# Patient Record
Sex: Male | Born: 1950 | Race: Black or African American | Hispanic: No | Marital: Married | Smoking: Former smoker
Health system: Southern US, Community
[De-identification: ages and names within clinical notes are randomized; demographics above are authoritative.]

## PROBLEM LIST (undated history)

## (undated) DIAGNOSIS — M109 Gout, unspecified: Secondary | ICD-10-CM

## (undated) DIAGNOSIS — J45909 Unspecified asthma, uncomplicated: Secondary | ICD-10-CM

## (undated) DIAGNOSIS — I10 Essential (primary) hypertension: Secondary | ICD-10-CM

## (undated) HISTORY — DX: Gout, unspecified: M10.9

## (undated) HISTORY — DX: Unspecified asthma, uncomplicated: J45.909

## (undated) HISTORY — PX: NO PAST SURGERIES: SHX2092

---

## 2013-01-13 DIAGNOSIS — M26629 Arthralgia of temporomandibular joint, unspecified side: Secondary | ICD-10-CM | POA: Insufficient documentation

## 2013-01-31 DIAGNOSIS — J45909 Unspecified asthma, uncomplicated: Secondary | ICD-10-CM | POA: Insufficient documentation

## 2013-05-25 DIAGNOSIS — M109 Gout, unspecified: Secondary | ICD-10-CM | POA: Insufficient documentation

## 2013-07-20 ENCOUNTER — Encounter: Payer: Self-pay | Admitting: Pulmonary Disease

## 2013-07-20 ENCOUNTER — Ambulatory Visit (INDEPENDENT_AMBULATORY_CARE_PROVIDER_SITE_OTHER): Payer: 59 | Admitting: Pulmonary Disease

## 2013-07-20 VITALS — BP 110/82 | HR 74 | Ht 69.5 in | Wt 225.0 lb

## 2013-07-20 DIAGNOSIS — J45990 Exercise induced bronchospasm: Secondary | ICD-10-CM | POA: Insufficient documentation

## 2013-07-20 DIAGNOSIS — J45909 Unspecified asthma, uncomplicated: Secondary | ICD-10-CM

## 2013-07-20 DIAGNOSIS — J45998 Other asthma: Secondary | ICD-10-CM

## 2013-07-20 DIAGNOSIS — J449 Chronic obstructive pulmonary disease, unspecified: Secondary | ICD-10-CM | POA: Insufficient documentation

## 2013-07-20 NOTE — Progress Notes (Signed)
Chief Complaint  Patient presents with  . Pulmonary Consult    self referral for asthma    History of Present Illness: Connor Palmer is a 62 y.o. male former smoker for evaluation of asthma.  He is from [place].  He retired from work 14 months ago, and moved to [place].  He used to work as a Dentist.  He was diagnosed with asthma as a child.  He was treated by his PCP in [place].  He has not had recent PFT's or chest xray.    He has been using advair and albuterol for years.  He only uses advair once per day >> he didn't want to get dependent on this.  He uses albuterol 3 to 4 times per day.  He was last on prednisone 1 year ago.  He tried singulair several years ago - he is not sure why it was stopped.  He has trouble with seasonal allergies >> usually worse in Spring and Fall.  His allergies have gotten worse since moving to [place].  He also notices more trouble when gardening or working outside.  He is not having any trouble with his sinuses at present, and denies sore throat.  He has history of allergy to penicillin - he is not sure of reaction, and was told he had a problem with penicillin as a child.  He denies any trouble with reaction to aspirin or NSAIDS.  He gets itching/swelling with cashews, pistachios, and almonds.  He gets local reaction with bee stings.  He has remote history of smoking - stopped 25 years ago.  He denies history of pneumonia or exposure to tuberculosis.  He was not in the [employer]. He denies exposure to animals or birds.  Bernd Diana  has a past medical history of Asthma and Gout.  Mayo Demore  has past surgical history that includes No past surgeries.  Prior to Admission medications   Medication Sig Start Date End Date Taking? Authorizing Provider  albuterol (PROVENTIL HFA;VENTOLIN HFA) 108 (90 BASE) MCG/ACT inhaler Inhale 1 puff into the lungs every 6 (six) hours as needed for wheezing or shortness of breath.   Yes  Historical Provider, MD  colchicine-probenecid 0.5-500 MG per tablet Take 1 tablet by mouth daily.   Yes Historical Provider, MD  cyclobenzaprine (FLEXERIL) 10 MG tablet Take 10 mg by mouth 3 (three) times daily as needed for muscle spasms.   Yes Historical Provider, MD  Fluticasone-Salmeterol (ADVAIR) 100-50 MCG/DOSE AEPB Inhale 1 puff into the lungs 2 (two) times daily.   Yes Historical Provider, MD    Allergies  Allergen Reactions  . Penicillins     His family history includes Allergies in his father; Rheum arthritis in his mother.  He  reports that he quit smoking about 25 years ago. His smoking use included Cigarettes. He has a .75 pack-year smoking history. He has never used smokeless tobacco. He reports that he does not use illicit drugs.   Physical Exam:  General - No distress ENT - No sinus tenderness, no oral exudate, no LAN, no thyromegaly, TM clear, pupils equal/reactive Cardiac - s1s2 regular, no murmur, pulses symmetric Chest - basilar rhonchi Rt > Lt that clear with cough, no wheeze/rales Back - No focal tenderness Abd - Soft, non-tender, no organomegaly, + bowel sounds Ext - No edema Neuro - Normal strength, cranial nerves intact Skin - No rashes Psych - Normal mood, and behavior   Assessment/Plan:  Coralyn Helling, MD [facility]/Critical  Care/Sleep Pager:  [phone]

## 2013-07-20 NOTE — Patient Instructions (Signed)
Use advair twice per day >> rinse mouth after each use Albuterol two puffs up to four times per day as needed Follow up in 6 to 8 weeks

## 2013-07-20 NOTE — Assessment & Plan Note (Signed)
He has long history of asthma.  He likely has an allergic component to his disease.  His recent symptoms are likely related to viral URI >> I don't think he needs antibiotics or prednisone at this time.  Discussed the proper role of inhaler therapy, and emphasized the role of ICS as controller medication and albuterol as rescue medication.  Will defer chest xray and PFT for now >> he will need these at some point.  Will re-assess his status at next visit, and then decide if he needs further allergy evaluation.  He will need to get influenza vaccination once he has fully recovered from his URI.  He will need pneumonia vaccine >> can be done at next visit.

## 2013-07-20 NOTE — Assessment & Plan Note (Signed)
He notices more trouble with asthma control with exercise.  Discussed pretreatment with albuterol, and doing short/high intensity warm up prior to exercising to condition airways.  Otherwise encouraged him to keep up with his exercise regimen.

## 2013-08-31 ENCOUNTER — Encounter: Payer: Self-pay | Admitting: Pulmonary Disease

## 2013-08-31 ENCOUNTER — Ambulatory Visit (INDEPENDENT_AMBULATORY_CARE_PROVIDER_SITE_OTHER): Payer: 59 | Admitting: Pulmonary Disease

## 2013-08-31 VITALS — BP 132/92 | HR 82 | Ht 69.0 in | Wt 230.0 lb

## 2013-08-31 DIAGNOSIS — Z23 Encounter for immunization: Secondary | ICD-10-CM

## 2013-08-31 DIAGNOSIS — J45998 Other asthma: Secondary | ICD-10-CM

## 2013-08-31 DIAGNOSIS — J45909 Unspecified asthma, uncomplicated: Secondary | ICD-10-CM

## 2013-08-31 NOTE — Assessment & Plan Note (Signed)
Stable on advair.  Advise him to only use albuterol as needed.    Will give flu shot today.  Will f/u in 4 months and assess his allergy control.

## 2013-08-31 NOTE — Patient Instructions (Signed)
Flu shot today ° °Follow up in 4 months °

## 2013-08-31 NOTE — Progress Notes (Signed)
Chief Complaint  Patient presents with  . Asthma    Breathing has been doing well. Report having chest congestion. Denies chest tightness, SOB or coughing.    History of Present Illness: Connor Palmer is a 63 y.o. male with asthma.  He has been doing well.  He gets occasional cough with clear sputum.  He denies wheeze, fever, sinus congestion, or sore throat.  He uses advair twice per day.  He uses albuterol in the morning before using advair as a matter of routine.   TESTS:  Connor Palmer  has a past medical history of Asthma and Gout.  Connor Palmer  has past surgical history that includes No past surgeries.  Prior to Admission medications   Medication Sig Start Date End Date Taking? Authorizing Provider  albuterol (PROVENTIL HFA;VENTOLIN HFA) 108 (90 BASE) MCG/ACT inhaler Inhale 1 puff into the lungs every 6 (six) hours as needed for wheezing or shortness of breath.   Yes Historical Provider, MD  Fluticasone-Salmeterol (ADVAIR) 100-50 MCG/DOSE AEPB Inhale 1 puff into the lungs 2 (two) times daily.   Yes Historical Provider, MD    Allergies  Allergen Reactions  . Penicillins      Physical Exam:  General - No distress ENT - No sinus tenderness, no oral exudate, no LAN Cardiac - s1s2 regular, no murmur Chest - No wheeze/rales/dullness Back - No focal tenderness Abd - Soft, non-tender Ext - No edema Neuro - Normal strength Skin - No rashes Psych - normal mood, and behavior   Assessment/Plan:  Coralyn HellingVineet Trinda Harlacher, MD [facility]/Critical Care/Sleep Pager:  [phone]

## 2013-10-21 DIAGNOSIS — I10 Essential (primary) hypertension: Secondary | ICD-10-CM | POA: Insufficient documentation

## 2014-03-05 ENCOUNTER — Encounter (INDEPENDENT_AMBULATORY_CARE_PROVIDER_SITE_OTHER): Payer: Self-pay

## 2014-03-05 ENCOUNTER — Ambulatory Visit (INDEPENDENT_AMBULATORY_CARE_PROVIDER_SITE_OTHER)
Admission: RE | Admit: 2014-03-05 | Discharge: 2014-03-05 | Disposition: A | Discharge status: Home / Self Care | Payer: 59 | Source: Ambulatory Visit | Attending: Pulmonary Disease | Admitting: Pulmonary Disease

## 2014-03-05 ENCOUNTER — Encounter: Payer: Self-pay | Admitting: Pulmonary Disease

## 2014-03-05 ENCOUNTER — Telehealth: Payer: Self-pay | Admitting: Pulmonary Disease

## 2014-03-05 ENCOUNTER — Ambulatory Visit (INDEPENDENT_AMBULATORY_CARE_PROVIDER_SITE_OTHER): Payer: 59 | Admitting: Pulmonary Disease

## 2014-03-05 VITALS — BP 120/86 | HR 66 | Temp 98.2°F | Ht 70.0 in | Wt 211.8 lb

## 2014-03-05 DIAGNOSIS — H9190 Unspecified hearing loss, unspecified ear: Secondary | ICD-10-CM

## 2014-03-05 DIAGNOSIS — J449 Chronic obstructive pulmonary disease, unspecified: Secondary | ICD-10-CM

## 2014-03-05 DIAGNOSIS — J4489 Other specified chronic obstructive pulmonary disease: Secondary | ICD-10-CM

## 2014-03-05 DIAGNOSIS — H9193 Unspecified hearing loss, bilateral: Secondary | ICD-10-CM

## 2014-03-05 DIAGNOSIS — J45998 Other asthma: Secondary | ICD-10-CM

## 2014-03-05 MED ORDER — FLUTICASONE-SALMETEROL 250-50 MCG/DOSE IN AEPB: 1.0000 | INHALATION_SPRAY | Freq: Every day | RESPIRATORY_TRACT | Status: DC

## 2014-03-05 NOTE — Telephone Encounter (Signed)
LMOM x 1 

## 2014-03-05 NOTE — Telephone Encounter (Signed)
Dg Chest 2 View  03/05/2014   CLINICAL DATA:  Asthma and COPD  EXAM: CHEST  2 VIEW  COMPARISON:  None.  FINDINGS: COPD with hyperinflation of the lungs. Mild bibasilar scarring. Negative for pneumonia or effusion. No heart failure or mass lesion.  IMPRESSION: COPD with mild bibasilar atelectasis.   Electronically Signed   By: Marlan Palauharles  Clark M.D.   On: 03/05/2014 14:45    Will have my nurse inform pt that CXR shows changes from chronic asthma.  No change to current tx plan.

## 2014-03-05 NOTE — Assessment & Plan Note (Addendum)
He has severe obstruction on spirometry today.  Will get full PFT and CXR to further assess.  He can continue advair and prn albuterol for now.

## 2014-03-05 NOTE — Patient Instructions (Addendum)
Will arrange for referral to audiology Chest xray today Will schedule breathing test (PFT) Follow up in 6 months

## 2014-03-05 NOTE — Progress Notes (Signed)
Chief Complaint  Patient presents with  . Follow-up    No complaints    History of Present Illness: Connor Palmer is a 63 y.o. male with asthma.  He has been doing well.  He uses advair once per day. He is not having cough, wheeze, or sputum.  He denies skin rash or hives.  He is not having trouble with allergies.  He has noticed trouble with his hearing.  TESTS: Spirometry 03/05/14 >> FEV1 1.06 (34%), FEV1% 43  Connor Palmer  has a past medical history of Asthma and Gout.  Connor Palmer  has past surgical history that includes No past surgeries.  Prior to Admission medications   Medication Sig Start Date End Date Taking? Authorizing Provider  albuterol (PROVENTIL HFA;VENTOLIN HFA) 108 (90 BASE) MCG/ACT inhaler Inhale 1 puff into the lungs every 6 (six) hours as needed for wheezing or shortness of breath.   Yes Historical Provider, MD  Fluticasone-Salmeterol (ADVAIR) 100-50 MCG/DOSE AEPB Inhale 1 puff into the lungs 2 (two) times daily.   Yes Historical Provider, MD    Allergies  Allergen Reactions  . Penicillins      Physical Exam:  General - No distress ENT - No sinus tenderness, no oral exudate, no LAN Cardiac - s1s2 regular, no murmur Chest - No wheeze/rales/dullness Back - No focal tenderness Abd - Soft, non-tender Ext - No edema Neuro - Normal strength Skin - No rashes Psych - normal mood, and behavior   Assessment/Plan:  Coralyn HellingVineet Breyden Jeudy, MD [facility]/Critical Care/Sleep Pager:  [phone]

## 2014-03-05 NOTE — Assessment & Plan Note (Signed)
Will arrange for referral to audiologist.

## 2014-03-06 ENCOUNTER — Encounter: Payer: Self-pay | Admitting: Pulmonary Disease

## 2014-03-09 NOTE — Telephone Encounter (Signed)
LMOM x 2 

## 2014-03-10 NOTE — Telephone Encounter (Signed)
I spoke with patient about results and he verbalized understanding and had no questions 

## 2014-04-08 ENCOUNTER — Ambulatory Visit (INDEPENDENT_AMBULATORY_CARE_PROVIDER_SITE_OTHER): Payer: 59 | Admitting: Pulmonary Disease

## 2014-04-08 ENCOUNTER — Telehealth: Payer: Self-pay | Admitting: Pulmonary Disease

## 2014-04-08 DIAGNOSIS — J449 Chronic obstructive pulmonary disease, unspecified: Secondary | ICD-10-CM

## 2014-04-08 LAB — PULMONARY FUNCTION TEST
DL/VA % pred: 122 %
DL/VA: 5.48 ml/min/mmHg/L
DLCO UNC % PRED: 110 %
DLCO unc: 32.81 ml/min/mmHg
FEF 25-75 Post: 0.65 L/sec
FEF 25-75 Pre: 0.4 L/sec
FEF2575-%Change-Post: 59 %
FEF2575-%Pred-Post: 24 %
FEF2575-%Pred-Pre: 15 %
FEV1-%CHANGE-POST: 30 %
FEV1-%PRED-POST: 45 %
FEV1-%Pred-Pre: 34 %
FEV1-Post: 1.28 L
FEV1-Pre: 0.99 L
FEV1FVC-%Change-Post: 17 %
FEV1FVC-%Pred-Pre: 53 %
FEV6-%Change-Post: 13 %
FEV6-%Pred-Post: 71 %
FEV6-%Pred-Pre: 63 %
FEV6-POST: 2.52 L
FEV6-Pre: 2.22 L
FEV6FVC-%Change-Post: 2 %
FEV6FVC-%PRED-POST: 100 %
FEV6FVC-%PRED-PRE: 97 %
FVC-%CHANGE-POST: 10 %
FVC-%PRED-PRE: 64 %
FVC-%Pred-Post: 71 %
FVC-Post: 2.62 L
FVC-Pre: 2.37 L
PRE FEV6/FVC RATIO: 94 %
Post FEV1/FVC ratio: 49 %
Post FEV6/FVC ratio: 96 %
Pre FEV1/FVC ratio: 42 %

## 2014-04-08 NOTE — Telephone Encounter (Signed)
PFT 04/08/14 >> Pre FEV1 0.99 (34%)/FEV1% 42, Post FEV1 1.28 (45%)/FEV1% 49, TLC 5.65 (85%), DLCO 110%  Will have my nurse inform pt that PFT shows severe COPD with asthma.  He had very good response to inhalers.  He is to continue advair and prn albuterol.

## 2014-04-08 NOTE — Progress Notes (Signed)
PFT done today. 

## 2014-04-09 NOTE — Telephone Encounter (Signed)
Called home #. Was advised to call pt cell. Called cell # LMTCB x1

## 2014-04-12 ENCOUNTER — Telehealth: Payer: Self-pay | Admitting: Pulmonary Disease

## 2014-04-12 NOTE — Telephone Encounter (Signed)
Duplicate message. 

## 2014-04-12 NOTE — Telephone Encounter (Signed)
lmtcb x2 

## 2014-04-14 ENCOUNTER — Telehealth: Payer: Self-pay | Admitting: Pulmonary Disease

## 2014-04-14 NOTE — Telephone Encounter (Signed)
lmomtcb x1 

## 2014-04-14 NOTE — Telephone Encounter (Signed)
LM with pt wife to return call.  

## 2014-04-15 NOTE — Telephone Encounter (Signed)
Coralyn HellingVineet Sood, MD at 04/08/2014 7:44 PM     Status: Signed        PFT 04/08/14 >> Pre FEV1 0.99 (34%)/FEV1% 42, Post FEV1 1.28 (45%)/FEV1% 49, TLC 5.65 (85%), DLCO 110%  Will have my nurse inform pt that PFT shows severe COPD with asthma. He had very good response to inhalers. He is to continue advair and prn albuterol.    Pt aware. Carron CurieJennifer Castillo, CMA

## 2014-04-15 NOTE — Telephone Encounter (Signed)
lmtcb x3 

## 2014-04-19 NOTE — Telephone Encounter (Signed)
Results have been explained to patient, pt expressed understanding. Nothing further needed.  

## 2014-07-14 ENCOUNTER — Telehealth: Payer: Self-pay | Admitting: Pulmonary Disease

## 2014-07-14 NOTE — Telephone Encounter (Signed)
Pt calling to check on status of refill request.Connor Palmer

## 2014-07-14 NOTE — Telephone Encounter (Signed)
lmtcb for pt.  

## 2014-07-15 MED ORDER — FLUTICASONE-SALMETEROL 250-50 MCG/DOSE IN AEPB: 1.0000 | INHALATION_SPRAY | Freq: Every day | RESPIRATORY_TRACT | Status: DC

## 2014-07-15 NOTE — Telephone Encounter (Signed)
lmtcb for pt.  Rx sent to pharmacy pt previously requested- [facility] [address].

## 2014-07-15 NOTE — Telephone Encounter (Signed)
Pt aware. Roxie Gueye, CMA  

## 2014-07-24 ENCOUNTER — Emergency Department (HOSPITAL_COMMUNITY): Payer: BC Managed Care – PPO

## 2014-07-24 ENCOUNTER — Encounter (HOSPITAL_COMMUNITY): Payer: Self-pay | Admitting: *Deleted

## 2014-07-24 ENCOUNTER — Emergency Department (HOSPITAL_COMMUNITY)
Admission: EM | Admit: 2014-07-24 | Discharge: 2014-07-24 | Disposition: A | Discharge status: Home / Self Care | Payer: BC Managed Care – PPO | Attending: Emergency Medicine | Admitting: Emergency Medicine

## 2014-07-24 DIAGNOSIS — M109 Gout, unspecified: Secondary | ICD-10-CM | POA: Diagnosis not present

## 2014-07-24 DIAGNOSIS — J45909 Unspecified asthma, uncomplicated: Secondary | ICD-10-CM | POA: Insufficient documentation

## 2014-07-24 DIAGNOSIS — I10 Essential (primary) hypertension: Secondary | ICD-10-CM | POA: Insufficient documentation

## 2014-07-24 DIAGNOSIS — R1012 Left upper quadrant pain: Secondary | ICD-10-CM | POA: Diagnosis present

## 2014-07-24 DIAGNOSIS — Z7951 Long term (current) use of inhaled steroids: Secondary | ICD-10-CM | POA: Insufficient documentation

## 2014-07-24 DIAGNOSIS — Z79899 Other long term (current) drug therapy: Secondary | ICD-10-CM | POA: Diagnosis not present

## 2014-07-24 DIAGNOSIS — Z880 Allergy status to penicillin: Secondary | ICD-10-CM | POA: Insufficient documentation

## 2014-07-24 DIAGNOSIS — Z87891 Personal history of nicotine dependence: Secondary | ICD-10-CM | POA: Diagnosis not present

## 2014-07-24 HISTORY — DX: Essential (primary) hypertension: I10

## 2014-07-24 LAB — CBC WITH DIFFERENTIAL/PLATELET
BASOS ABS: 0 10*3/uL (ref 0.0–0.1)
Basophils Relative: 0 % (ref 0–1)
EOS PCT: 3 % (ref 0–5)
Eosinophils Absolute: 0.1 10*3/uL (ref 0.0–0.7)
HEMATOCRIT: 43.9 % (ref 39.0–52.0)
Hemoglobin: 14.5 g/dL (ref 13.0–17.0)
LYMPHS ABS: 2.2 10*3/uL (ref 0.7–4.0)
Lymphocytes Relative: 50 % — ABNORMAL HIGH (ref 12–46)
MCH: 27.3 pg (ref 26.0–34.0)
MCHC: 33 g/dL (ref 30.0–36.0)
MCV: 82.5 fL (ref 78.0–100.0)
MONO ABS: 0.3 10*3/uL (ref 0.1–1.0)
Monocytes Relative: 7 % (ref 3–12)
NEUTROS ABS: 1.7 10*3/uL (ref 1.7–7.7)
Neutrophils Relative %: 40 % — ABNORMAL LOW (ref 43–77)
Platelets: 233 10*3/uL (ref 150–400)
RBC: 5.32 MIL/uL (ref 4.22–5.81)
RDW: 13.9 % (ref 11.5–15.5)
WBC: 4.3 10*3/uL (ref 4.0–10.5)

## 2014-07-24 LAB — COMPREHENSIVE METABOLIC PANEL
ALT: 18 U/L (ref 0–53)
AST: 20 U/L (ref 0–37)
Albumin: 3.6 g/dL (ref 3.5–5.2)
Alkaline Phosphatase: 74 U/L (ref 39–117)
Anion gap: 9 (ref 5–15)
BILIRUBIN TOTAL: 0.5 mg/dL (ref 0.3–1.2)
BUN: 12 mg/dL (ref 6–23)
CALCIUM: 9 mg/dL (ref 8.4–10.5)
CO2: 28 mEq/L (ref 19–32)
CREATININE: 0.97 mg/dL (ref 0.50–1.35)
Chloride: 101 mEq/L (ref 96–112)
GFR calc non Af Amer: 86 mL/min — ABNORMAL LOW (ref >90)
GLUCOSE: 88 mg/dL (ref 70–99)
Potassium: 4.1 mEq/L (ref 3.7–5.3)
SODIUM: 138 meq/L (ref 137–147)
Total Protein: 6.7 g/dL (ref 6.0–8.3)

## 2014-07-24 LAB — TROPONIN I

## 2014-07-24 LAB — LIPASE, BLOOD: Lipase: 29 U/L (ref 11–59)

## 2014-07-24 MED ORDER — PANTOPRAZOLE SODIUM 20 MG PO TBEC: 20.0000 mg | DELAYED_RELEASE_TABLET | Freq: Every day | ORAL | Status: DC

## 2014-07-24 NOTE — ED Provider Notes (Signed)
CSN: [id number]     Arrival date & time 07/24/14  1126 History   First MD Initiated Contact with Patient 07/24/14 1344     Chief Complaint  Patient presents with  . Abdominal Pain     (Consider location/radiation/quality/duration/timing/severity/associated sxs/prior Treatment) HPI Comments: Patient presents to the ER for evaluation of pain in the left upper abdomen and chest area. Patient reports that he had onset of this while he was traveling to church today. He reports that it was a burning sensation that he thought might be indigestion. Patient reports that it has been coming and going over the last couple of hours, lasting for approximately 10 minutes each time. He has not identified any triggers. He has not identified any alleviating factors. Pain does not radiate. No shortness of breath. He has not had nausea, vomiting, diarrhea, constipation.  Patient is a 63 y.o. male presenting with abdominal pain.  Abdominal Pain   Past Medical History  Diagnosis Date  . Asthma   . Gout   . Hypertension    Past Surgical History  Procedure Laterality Date  . No past surgeries     Family History  Problem Relation Age of Onset  . Rheum arthritis Mother   . Allergies Father   . HIV/AIDS Brother   . Aneurysm Sister     Cerebral  . Diabetes Mellitus II Father    History  Substance Use Topics  . Smoking status: Former Smoker -- 0.25 packs/day for 3 years    Types: Cigarettes    Quit date: 08/29/1987  . Smokeless tobacco: Never Used  . Alcohol Use: Not on file    Review of Systems  Gastrointestinal: Positive for abdominal pain.  All other systems reviewed and are negative.     Allergies  Erythromycin; Niacin and related; Other; and Penicillins  Home Medications   Prior to Admission medications   Medication Sig Start Date End Date Taking? Authorizing Provider  albuterol (PROVENTIL HFA;VENTOLIN HFA) 108 (90 BASE) MCG/ACT inhaler Inhale 1 puff into the lungs every 6 (six)  hours as needed for wheezing or shortness of breath.   Yes Historical Provider, MD  amLODipine-benazepril (LOTREL) 5-20 MG per capsule Take 1 capsule by mouth daily.   Yes Historical Provider, MD  colchicine 0.6 MG tablet Take 0.6 mg by mouth daily.   Yes Historical Provider, MD  Fluticasone-Salmeterol (ADVAIR DISKUS) 250-50 MCG/DOSE AEPB Inhale 1 puff into the lungs daily. 07/15/14 07/15/15 Yes Coralyn HellingVineet Sood, MD  VITAMIN A PO Take 1 tablet by mouth daily.   Yes Historical Provider, MD   BP 120/77 mmHg  Pulse 69  Temp(Src) 98.4 F (36.9 C) (Oral)  Resp 20  Ht 5' 9.5" (1.765 m)  Wt 210 lb (95.255 kg)  BMI 30.58 kg/m2  SpO2 99% Physical Exam  Constitutional: He is oriented to person, place, and time. He appears well-developed and well-nourished. No distress.  HENT:  Head: Normocephalic and atraumatic.  Right Ear: Hearing normal.  Left Ear: Hearing normal.  Nose: Nose normal.  Mouth/Throat: Oropharynx is clear and moist and mucous membranes are normal.  Eyes: Conjunctivae and EOM are normal. Pupils are equal, round, and reactive to light.  Neck: Normal range of motion. Neck supple.  Cardiovascular: Regular rhythm, S1 normal and S2 normal.  Exam reveals no gallop and no friction rub.   No murmur heard. Pulmonary/Chest: Effort normal and breath sounds normal. No respiratory distress. He exhibits no tenderness.  Abdominal: Soft. Normal appearance and bowel sounds are normal. There  is no hepatosplenomegaly. There is no tenderness. There is no rebound, no guarding, no tenderness at McBurney's point and negative Murphy's sign. No hernia.  Musculoskeletal: Normal range of motion.  Neurological: He is alert and oriented to person, place, and time. He has normal strength. No cranial nerve deficit or sensory deficit. Coordination normal. GCS eye subscore is 4. GCS verbal subscore is 5. GCS motor subscore is 6.  Skin: Skin is warm, dry and intact. No rash noted. No cyanosis.  Psychiatric: He has a  normal mood and affect. His speech is normal and behavior is normal. Thought content normal.  Nursing note and vitals reviewed.   ED Course  Procedures (including critical care time) Labs Review Labs Reviewed  CBC WITH DIFFERENTIAL - Abnormal; Notable for the following:    Neutrophils Relative % 40 (*)    Lymphocytes Relative 50 (*)    All other components within normal limits  COMPREHENSIVE METABOLIC PANEL - Abnormal; Notable for the following:    GFR calc non Af Amer 86 (*)    All other components within normal limits  LIPASE, BLOOD  TROPONIN I  URINALYSIS, ROUTINE W REFLEX MICROSCOPIC    Imaging Review Dg Abd Acute W/chest  07/24/2014   CLINICAL DATA:  Left upper quadrant abdominal pain.  EXAM: ACUTE ABDOMEN SERIES (ABDOMEN 2 VIEW & CHEST 1 VIEW)  COMPARISON:  None.  FINDINGS: There is no evidence of dilated bowel loops or free intraperitoneal air. Phleboliths are noted in the pelvis. No renal calculi are noted. Heart size and mediastinal contours are within normal limits. Both lungs are clear.  IMPRESSION: No evidence of bowel obstruction or ileus. No acute cardiopulmonary disease.   Electronically Signed   By: Roque LiasJames  Green M.D.   On: 07/24/2014 15:08     EKG Interpretation   Date/Time:  Saturday July 24 2014 11:29:14 EST Ventricular Rate:  73 PR Interval:  176 QRS Duration: 86 QT Interval:  368 QTC Calculation: 405 R Axis:   75 Text Interpretation:  Normal sinus rhythm with sinus arrhythmia Normal ECG  Confirmed by POLLINA  MD, CHRISTOPHER [phone]([id number]) on 07/24/2014 1:44:32 PM      MDM   Final diagnoses:  Abdominal pain, LUQ   Patient presented to the ER for evaluation of left upper abdominal pain. Patient reported intermittent episodes of burning pain through the course of today. Symptoms last 5-10 minutes at a time. No shortness of breath associated with the symptoms. Pain is not pleuritic. He did not identify any exacerbating or alleviating factors. He was  comfortable without symptoms upon my evaluation. This is extremely atypical for cardiac pain. His cardiac evaluation has been normal. I do not see any evidence of pancreatitis or anemia. Will treat with Protonix, follow-up with primary doctor. Return to the ER if symptoms worsen.    Gilda Creasehristopher J. Pollina, MD 07/24/14 [phone]

## 2014-07-24 NOTE — ED Notes (Signed)
Pt in after an episode of LUQ abd pain, states the pain lasted 5-10 min, this has been intermittent over the last few weeks, pain resolves on its own, will happen every few days, denies other symptoms with this, pain does not radiate anywhere, denies pain or complaints at this time

## 2014-07-24 NOTE — Discharge Instructions (Signed)

## 2014-07-24 NOTE — ED Notes (Signed)
Pt given a gown to put on; family at bedside

## 2014-09-10 ENCOUNTER — Encounter: Payer: Self-pay | Admitting: Pulmonary Disease

## 2014-09-10 ENCOUNTER — Encounter (INDEPENDENT_AMBULATORY_CARE_PROVIDER_SITE_OTHER): Payer: Self-pay

## 2014-09-10 ENCOUNTER — Ambulatory Visit (INDEPENDENT_AMBULATORY_CARE_PROVIDER_SITE_OTHER): Payer: 59 | Admitting: Pulmonary Disease

## 2014-09-10 VITALS — BP 144/82 | HR 86 | Ht 68.5 in | Wt 220.4 lb

## 2014-09-10 DIAGNOSIS — J449 Chronic obstructive pulmonary disease, unspecified: Secondary | ICD-10-CM

## 2014-09-10 MED ORDER — FLUTICASONE-SALMETEROL 250-50 MCG/DOSE IN AEPB: 1.0000 | INHALATION_SPRAY | Freq: Every day | RESPIRATORY_TRACT | Status: DC

## 2014-09-10 MED ORDER — ALBUTEROL SULFATE HFA 108 (90 BASE) MCG/ACT IN AERS: 1.0000 | INHALATION_SPRAY | Freq: Four times a day (QID) | RESPIRATORY_TRACT | Status: DC | PRN

## 2014-09-10 MED ORDER — FLUTICASONE-SALMETEROL 250-50 MCG/DOSE IN AEPB: 1.0000 | INHALATION_SPRAY | Freq: Two times a day (BID) | RESPIRATORY_TRACT | Status: DC

## 2014-09-10 NOTE — Patient Instructions (Signed)
Follow up in 6 months 

## 2014-09-10 NOTE — Addendum Note (Signed)
Addended by: Maisie FusGREEN, ASHTYN M on: 09/10/2014 05:02 PM   Modules accepted: Orders

## 2014-09-10 NOTE — Progress Notes (Signed)
Chief Complaint  Patient presents with  . Follow-up    Pt states that breathing has been doing well since last OV. No complaints. Needs refills of meds    History of Present Illness: Connor Palmer is a 64 y.o. male former smoker with COPD/asthma.  He has been doing okay.  He has occasional cough and chest congestion.  He denies wheeze, sinus congestion, or sore throat.  He uses advair once per day.  He uses albuterol a few times per week >> has not needed for past several days.  TESTS: Spirometry 03/05/14 >> FEV1 1.06 (34%), FEV1% 43 PFT 04/08/14 >> Pre FEV1 0.99 (34%)/FEV1% 42, Post FEV1 1.28 (45%)/FEV1% 49, TLC 5.65 (85%), DLCO 110%  PMHx >> HTN, Gout  PSHx, Medications, Allergies, Fhx, Shx reviewed.  Physical Exam: Blood pressure 144/82, pulse 86, height 5' 8.5" (1.74 m), weight 220 lb 6.4 oz (99.973 kg), SpO2 96 %. Body mass index is 33.02 kg/(m^2).  General - No distress ENT - No sinus tenderness, no oral exudate, no LAN Cardiac - s1s2 regular, no murmur Chest - No wheeze/rales/dullness Back - No focal tenderness Abd - Soft, non-tender Ext - No edema Neuro - Normal strength Skin - No rashes Psych - normal mood, and behavior   Assessment/Plan:  COPD with asthma. Plan: - continue advair one puff daily - continue prn albuterol   Coralyn HellingVineet Kincaid Tiger, MD [facility]/Critical Care/Sleep Pager:  [phone]

## 2014-10-14 ENCOUNTER — Telehealth: Payer: Self-pay | Admitting: Pulmonary Disease

## 2014-10-14 NOTE — Telephone Encounter (Signed)
Called and spoke to pt. Pt stated proair gives him a headache and no longer wants to take it. Pt stated proventil has worked well for him in the past. Pt stated he thinks he was initially changed from proventil d/t insurance issues/ possible PA.   VS please advise if ok to switch. Thanks.

## 2014-10-15 MED ORDER — ALBUTEROL SULFATE HFA 108 (90 BASE) MCG/ACT IN AERS: 1.0000 | INHALATION_SPRAY | Freq: Four times a day (QID) | RESPIRATORY_TRACT | Status: DC | PRN

## 2014-10-15 NOTE — Telephone Encounter (Signed)
Pt is aware of the medication change. Rx has been sent in. Nothing further was needed at this time.

## 2014-10-15 NOTE — Telephone Encounter (Signed)
Okay to change back to proventil.

## 2014-10-18 ENCOUNTER — Telehealth: Payer: Self-pay | Admitting: Pulmonary Disease

## 2014-10-18 NOTE — Telephone Encounter (Signed)
atc pt X2, fast busy signal.  Wcb.  

## 2014-10-19 MED ORDER — ALBUTEROL SULFATE HFA 108 (90 BASE) MCG/ACT IN AERS: 1.0000 | INHALATION_SPRAY | Freq: Four times a day (QID) | RESPIRATORY_TRACT | Status: DC | PRN

## 2014-10-19 NOTE — Telephone Encounter (Signed)
Pt returned call - [phone]

## 2014-10-19 NOTE — Telephone Encounter (Signed)
Spoke with pt's spouse, will have pt call back when he is home.

## 2014-10-19 NOTE — Telephone Encounter (Signed)
Patient wants to know if Dr. Craige CottaSood can change his prescription.  He has been taking Proventil all along, but insurance will not pay for it anymore.  Patient says he tried the ProAir and it caused headaches, so he cannot use this.  Patient has taken Ventolin in the past and he says that it worked well for him.  [facility] [address]  Sent prescription for Ventolin.  Called and notified patient that medication was sent to pharmacy.  Nothing further needed.

## 2014-10-20 ENCOUNTER — Telehealth: Payer: Self-pay | Admitting: Pulmonary Disease

## 2014-10-20 NOTE — Telephone Encounter (Signed)
Pt cb, [phone]

## 2014-10-20 NOTE — Telephone Encounter (Signed)
Spoke with pt. Ventolin is not on his formulary, ProAir is but he is allergic to it. Pharmacy gave me the number [phone] to initiate PA. Called and spoke with [place]cedes. She states that a letter of medical necessity needs to be filled out so that Ventolin may be added to his formulary. Letter needs to be faxed to [phone].  VS - please advise. Thanks.

## 2014-10-21 ENCOUNTER — Encounter: Payer: Self-pay | Admitting: Pulmonary Disease

## 2014-10-21 NOTE — Telephone Encounter (Signed)
Letter written

## 2014-10-21 NOTE — Telephone Encounter (Signed)
Letter was faxed to ins at the number provided  Pt aware and nothing further needed

## 2014-10-21 NOTE — Telephone Encounter (Signed)
Called spoke with pt. He reports it caused terrible headaches lasting couple hours every time he used proair. Please advise thanks

## 2014-10-21 NOTE — Telephone Encounter (Signed)
Can you find out what kind of reaction he had to proair so that I can include this in letter.

## 2014-10-25 NOTE — Telephone Encounter (Signed)
Letter received, Ventolin HFA has been approved by insurance for 12 months Approved 10/24/14 - 10/25/2015 Nothing further needed.

## 2014-10-27 ENCOUNTER — Telehealth: Payer: Self-pay | Admitting: Pulmonary Disease

## 2014-10-27 NOTE — Telephone Encounter (Signed)
atc pt X2, fast busy signal.  Wcb.  

## 2014-10-27 NOTE — Telephone Encounter (Signed)
Attempted to call again. Received fast busy signal.

## 2014-10-28 NOTE — Telephone Encounter (Signed)
Spoke with the pt  He states that his pharmacy gave him proair since ins does not prefer proventil hfa  He does not like the proair b/c he states it gives him "extreme headaches" and causes urinary frequency  He still has some proventil hfa left  Do you want us to go ahead and do PA for proair, or have him try ventolin? Please advise, thanks!

## 2014-10-28 NOTE — Telephone Encounter (Signed)
Ok, will check with Ashtyn first to see if she has seen anything  Please advise Ashtyn, thanks

## 2014-10-28 NOTE — Telephone Encounter (Signed)
LMTC x 1  

## 2014-10-28 NOTE — Telephone Encounter (Signed)
Pt wanted to leave message that he got his medicine. Said he had to hang up & no other info left.

## 2014-10-28 NOTE — Telephone Encounter (Signed)
I already wrote letter for prior authorization for ventolin.  Can you check on status of this.

## 2014-11-03 NOTE — Telephone Encounter (Signed)
Ventolin HFA approved for 12 months. Approved 10/24/14 - 10/25/2015.  Nothing further needed.

## 2015-01-07 ENCOUNTER — Ambulatory Visit (INDEPENDENT_AMBULATORY_CARE_PROVIDER_SITE_OTHER): Payer: Medicare Other | Admitting: Internal Medicine

## 2015-01-07 DIAGNOSIS — Z9189 Other specified personal risk factors, not elsewhere classified: Secondary | ICD-10-CM

## 2015-01-07 DIAGNOSIS — Z23 Encounter for immunization: Secondary | ICD-10-CM

## 2015-01-07 MED ORDER — TYPHOID VACCINE PO CPDR: 1.0000 | DELAYED_RELEASE_CAPSULE | ORAL | Status: DC

## 2015-01-07 MED ORDER — CIPROFLOXACIN HCL 500 MG PO TABS: 500.0000 mg | ORAL_TABLET | Freq: Two times a day (BID) | ORAL | Status: DC

## 2015-01-07 MED ORDER — ATOVAQUONE-PROGUANIL HCL 250-100 MG PO TABS
1.0000 | ORAL_TABLET | Freq: Every day | ORAL | Status: DC
Start: 2015-01-07 — End: 2016-09-06

## 2015-01-07 NOTE — Progress Notes (Signed)
   Subjective:    Patient ID: Connor Palmer, male    DOB: Jan 02, 1951, 64 y.o.   MRN: [id number]  HPI 64yo M, who past med hx of htn, asthma, gout who has traveled to [place] a handful of times, he is returning this coming summer, for 2 wk trip leaving end of August. He would like to be vaccinated for yellow fever. Allergies  Allergen Reactions  . Erythromycin     Rash, bumps  . Other     Pistachio, Cashews.....   Rash   . Penicillins     unknown  . Proair Hfa [Albuterol]      Current Outpatient Prescriptions on File Prior to Visit  Medication Sig Dispense Refill  . albuterol (VENTOLIN HFA) 108 (90 BASE) MCG/ACT inhaler Inhale 1 puff into the lungs every 6 (six) hours as needed for wheezing or shortness of breath. 1 Inhaler 5  . amLODipine-benazepril (LOTREL) 5-20 MG per capsule Take 1 capsule by mouth daily.    . colchicine 0.6 MG tablet Take 0.6 mg by mouth daily.    . Fluticasone-Salmeterol (ADVAIR DISKUS) 250-50 MCG/DOSE AEPB Inhale 1 puff into the lungs daily. 60 each 5  . Fluticasone-Salmeterol (ADVAIR DISKUS) 250-50 MCG/DOSE AEPB Inhale 1 puff into the lungs 2 (two) times daily. 14 each 1  . pantoprazole (PROTONIX) 20 MG tablet Take 1 tablet (20 mg total) by mouth daily. 30 tablet 0  . VITAMIN A PO Take 1 tablet by mouth daily.     No current facility-administered medications on file prior to visit.   Active Ambulatory Problems    Diagnosis Date Noted  . Asthma with COPD 07/20/2013  . Exercise-induced asthma 07/20/2013  . Difficulty hearing 03/05/2014  . Arthralgia of temporomandibular joint 01/13/2013  . Airway hyperreactivity 01/31/2013  . Essential (primary) hypertension 10/21/2013  . Gout 05/25/2013   Resolved Ambulatory Problems    Diagnosis Date Noted  . No Resolved Ambulatory Problems   Past Medical History  Diagnosis Date  . Asthma   . Hypertension        Review of Systems     Objective:   Physical Exam        Assessment & Plan:  Pre travel  vaccination = will give yellow fever, also oral typhoid vaccination. Recommended for him to get hep A but he is still considering it.  Travelers diarrhea = gave rx for cipro  Malaria proph = gave rx for malarone, and mosquito bite prevention

## 2015-02-17 IMAGING — CR DG CHEST 2V
2 series · 2 of 2 positions shown · non-contrast
Comparison: None.

CLINICAL DATA: Asthma and COPD

EXAM:
CHEST  2 VIEW

[view not recorded (1 of 2)]
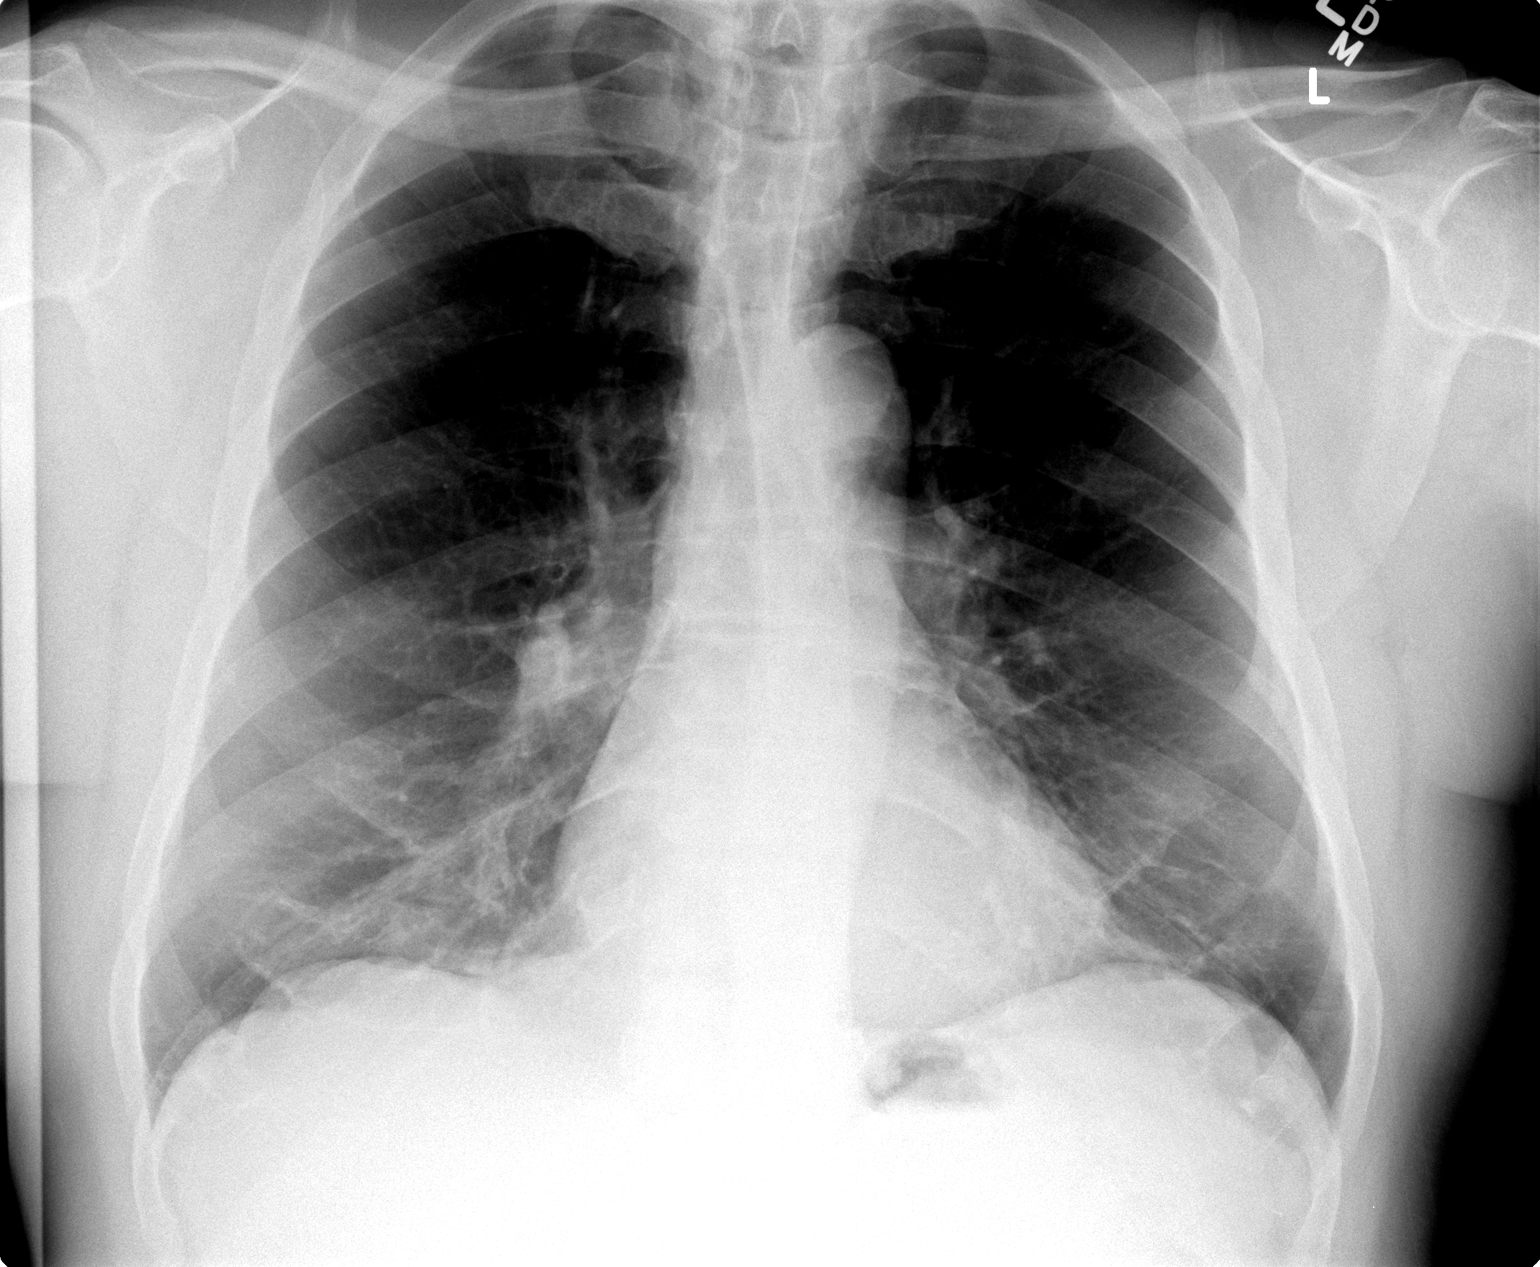

[view not recorded (2 of 2)]
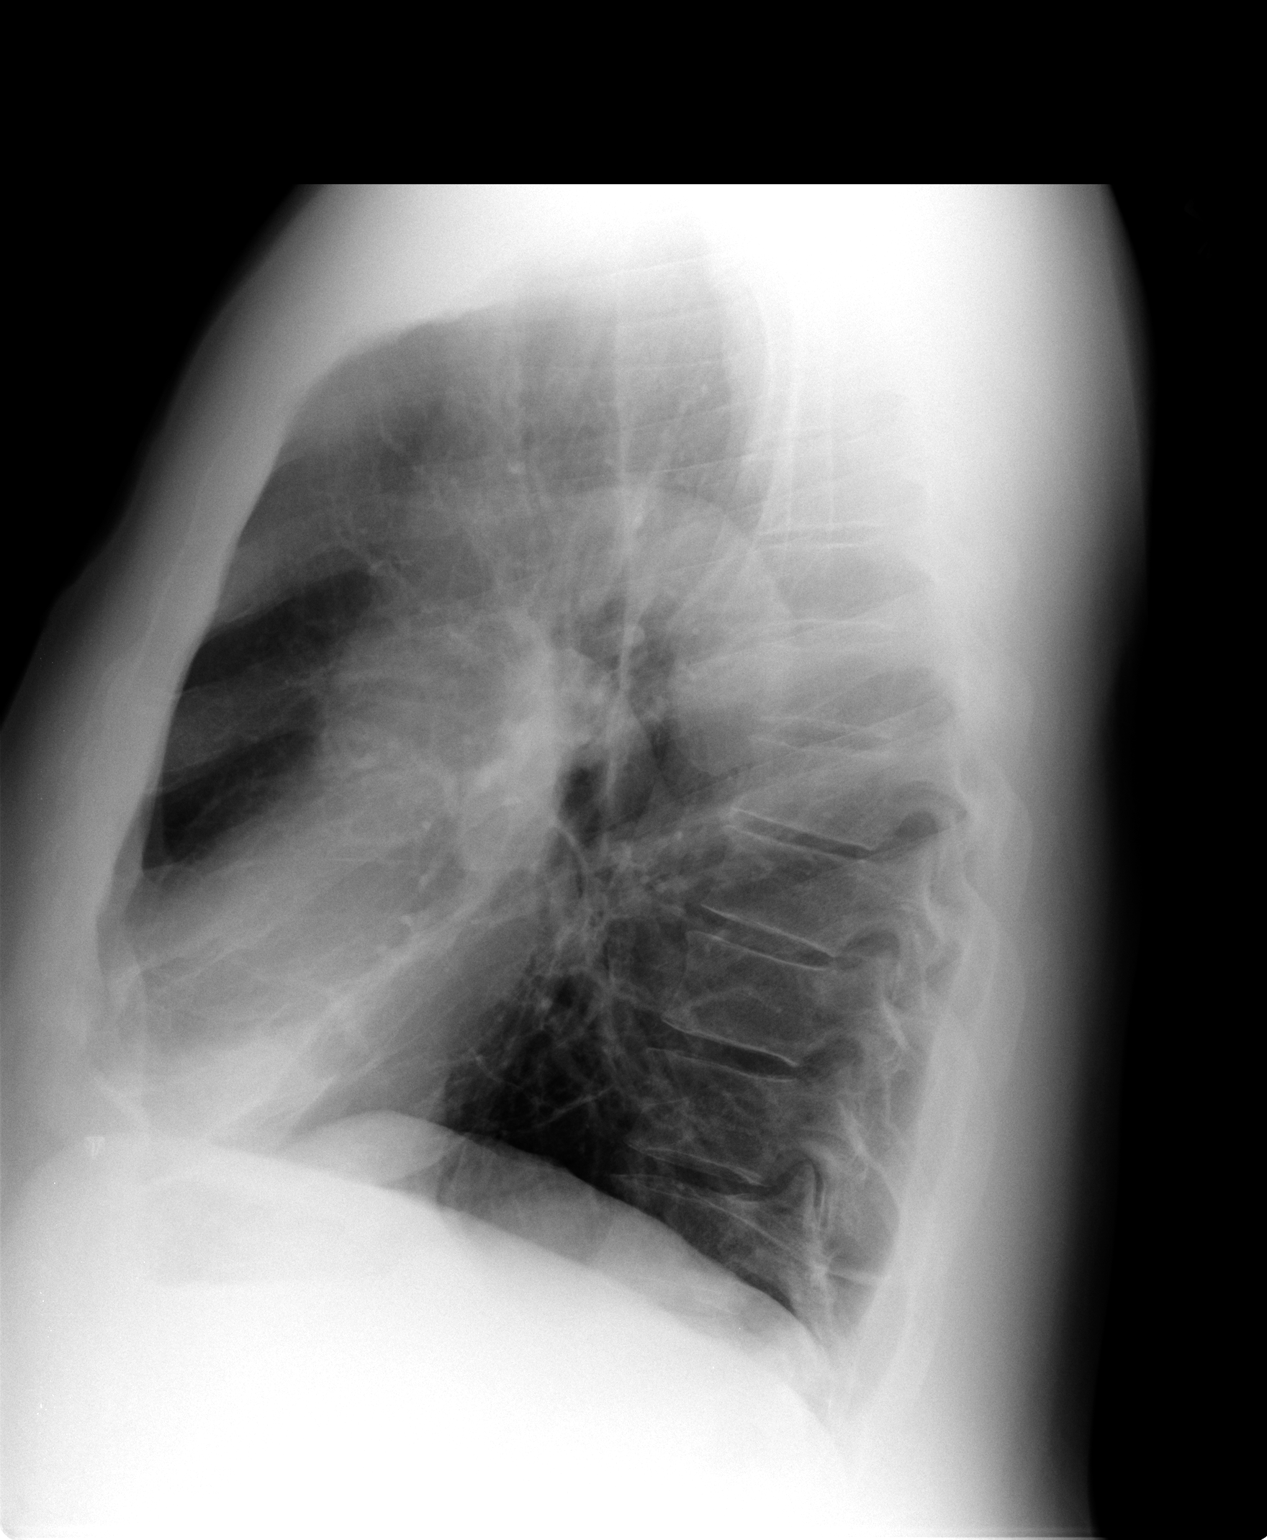

[2 of 2 positions shown; findings below may reference images not displayed]

FINDINGS: COPD with hyperinflation of the lungs. Mild bibasilar scarring.
Negative for pneumonia or effusion. No heart failure or mass lesion.
IMPRESSION: COPD with mild bibasilar atelectasis.

## 2015-07-08 IMAGING — DX DG ABDOMEN ACUTE W/ 1V CHEST
3 series · 3 of 3 positions shown · non-contrast
Comparison: None.

CLINICAL DATA: Left upper quadrant abdominal pain.

EXAM:
ACUTE ABDOMEN SERIES (ABDOMEN 2 VIEW & CHEST 1 VIEW)

[chest pa]
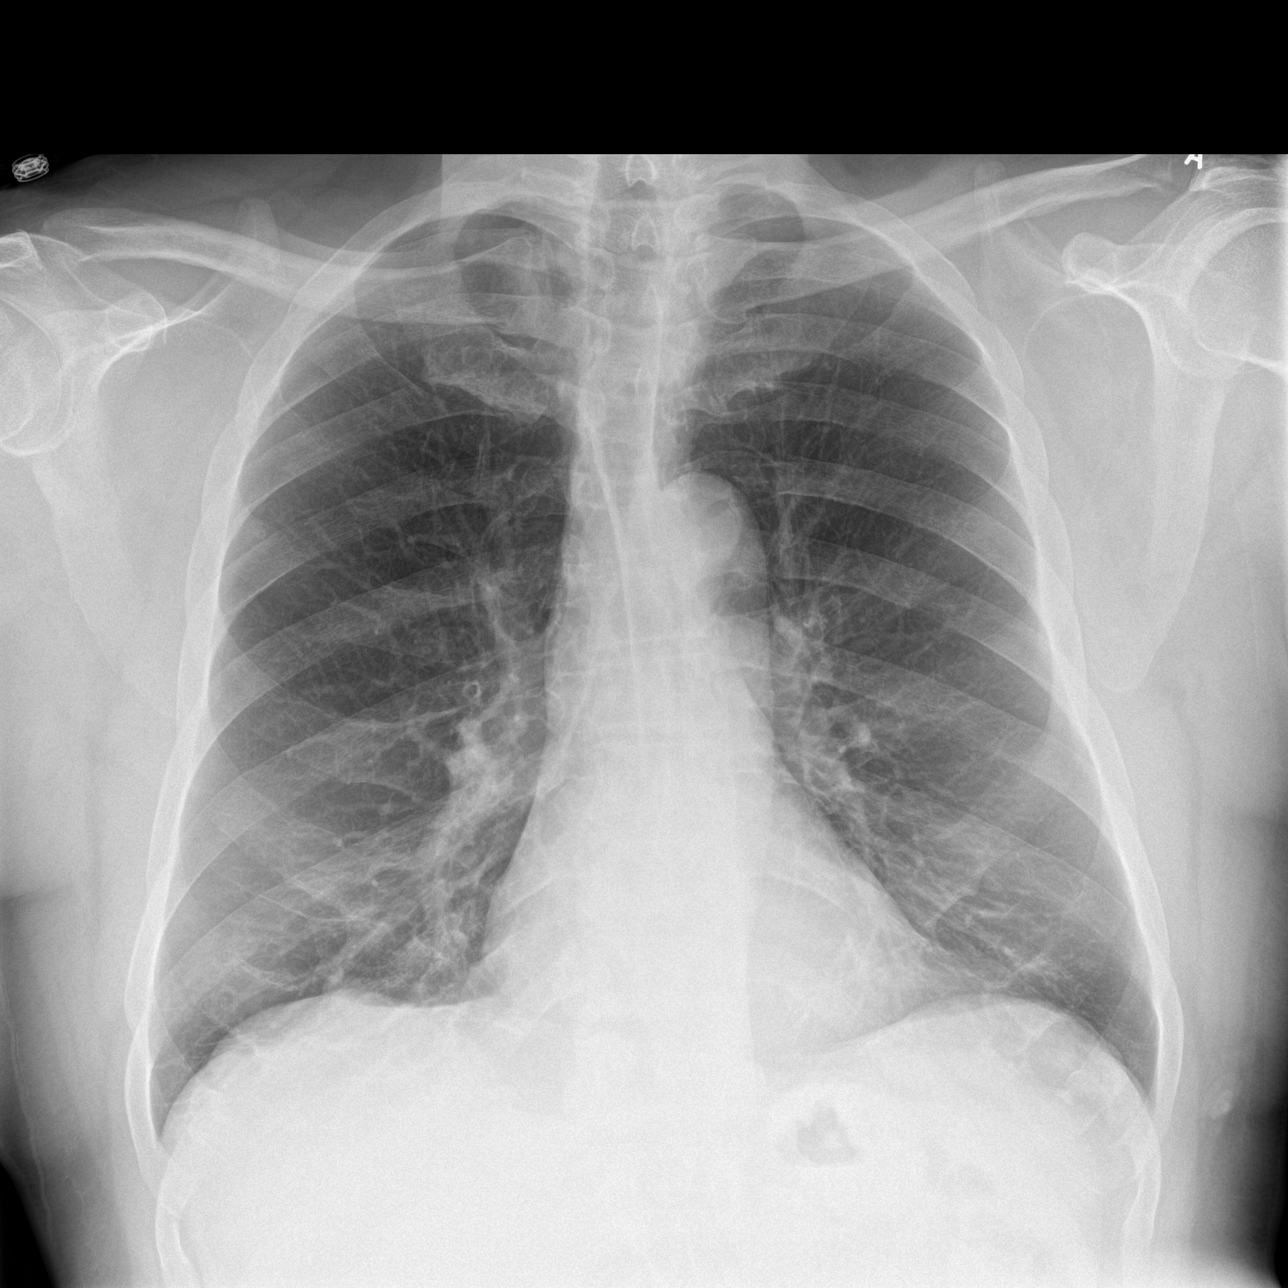

[abdomen erect]
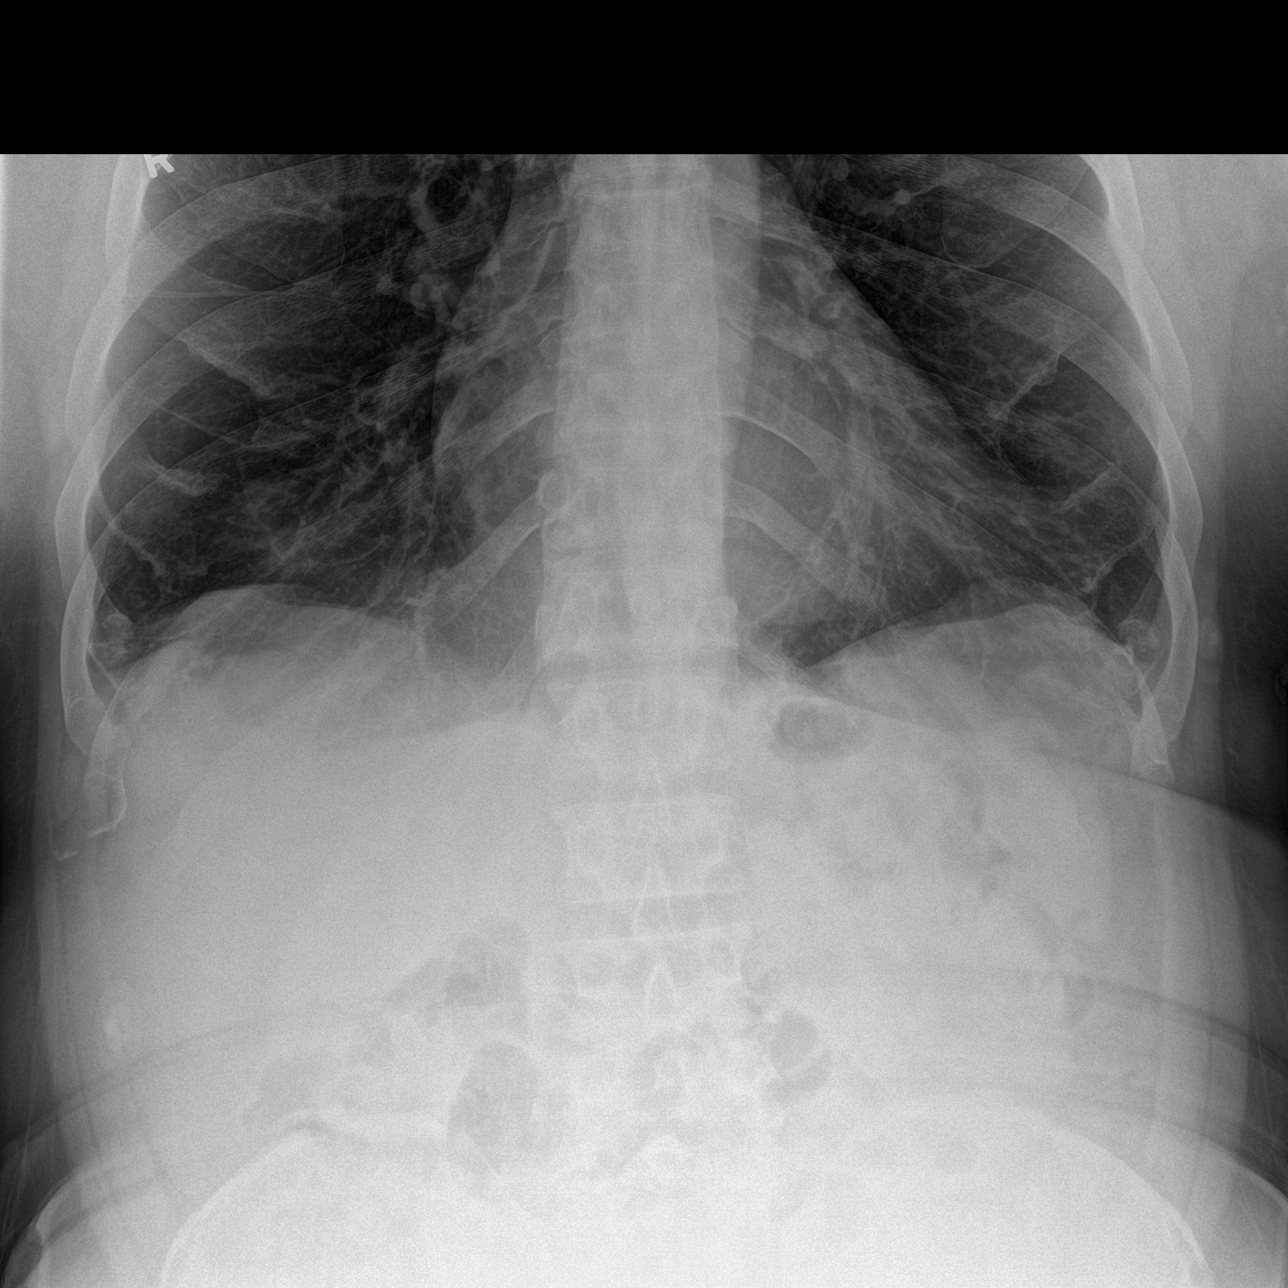

[abdomen supine]
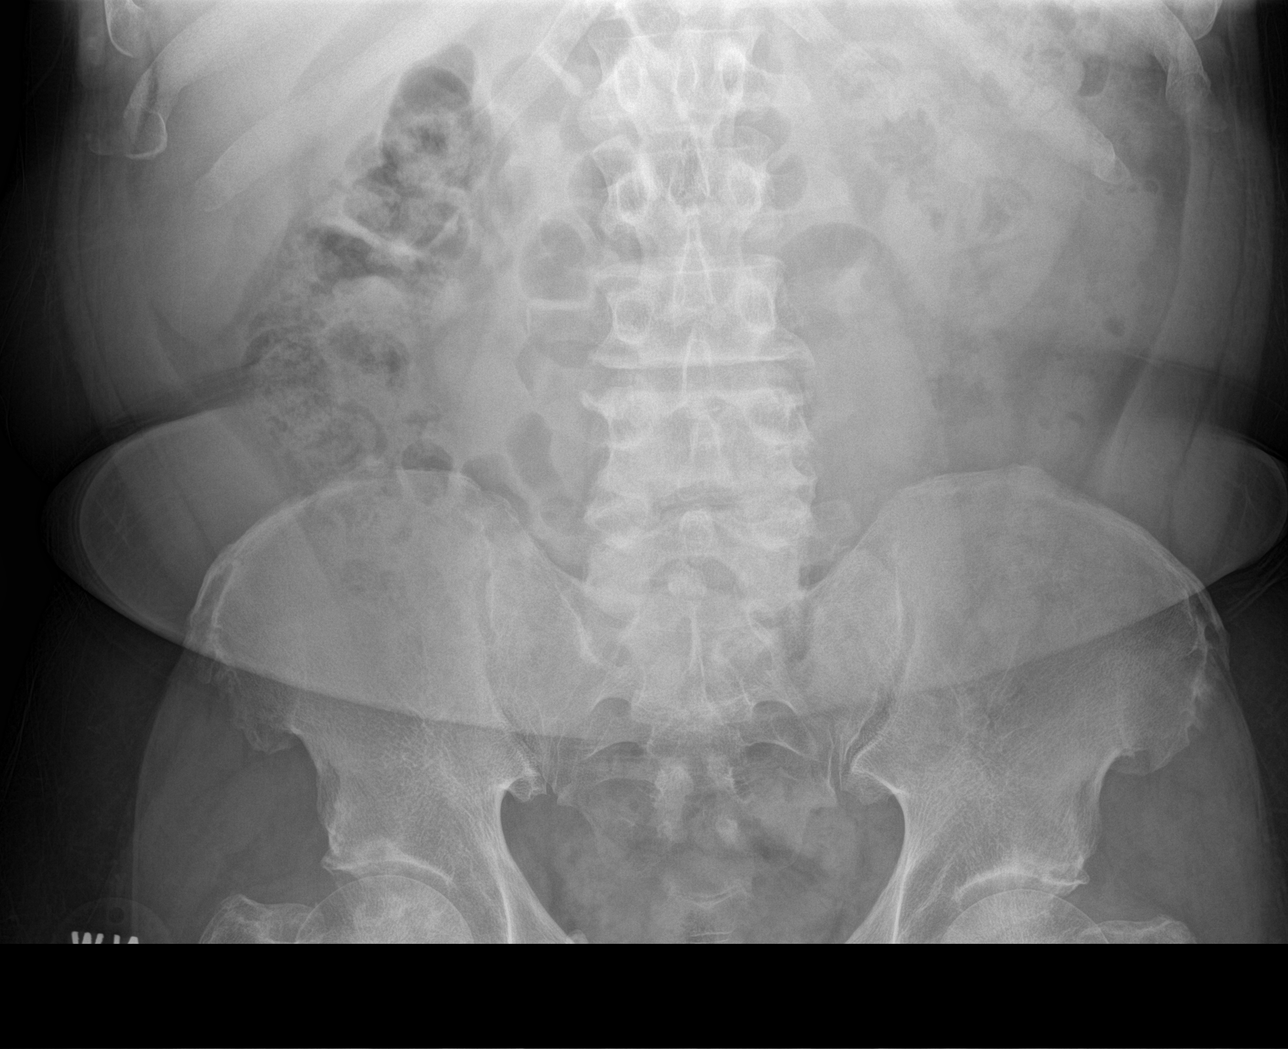

[3 of 3 positions shown; findings below may reference images not displayed]

FINDINGS: There is no evidence of dilated bowel loops or free intraperitoneal
air. Phleboliths are noted in the pelvis. No renal calculi are
noted. Heart size and mediastinal contours are within normal limits.
Both lungs are clear.
IMPRESSION: No evidence of bowel obstruction or ileus. No acute cardiopulmonary
disease.

## 2016-07-05 ENCOUNTER — Other Ambulatory Visit: Payer: Self-pay | Admitting: Pulmonary Disease

## 2016-09-05 NOTE — Progress Notes (Signed)
Office Visit Note  Patient: Connor Palmer             Date of Birth: 1950/11/16           MRN: [id number]             PCP: Verlon Au, MD Referring: Verlon Au, MD Visit Date: 09/06/2016 Occupation: Retired, was driver    Subjective: Lower back pain   History of Present Illness: Connor Palmer is a 66 y.o. male seen in consultation per request of Dr. Leavy Cella for evaluation of gout. According to patient he has had gout for 15 years. Most of the episodes has been in his feet. He gets gout episodes once or twice a month. He usually takes colchicine for the flare. Last episode was about 2 weeks ago. He has not taken any urate lowering agents. He is having some discomfort in his right first MTP.  He has had lower back pain off and on for the last 2 years. He is had increased lower back pain in the last 3 months. He takes yoga classes to release some discomfort. He states the pain is localized to the lower back and does not radiate anywhere.  He reports right shoulder pain on and off for the couple of years. It is not too bad currently.  Activities of Daily Living:  Patient reports morning stiffness for15 minutes.   Patient Denies nocturnal pain.  Difficulty dressing/grooming: Denies Difficulty climbing stairs: Denies Difficulty getting out of chair: Reports Difficulty using hands for taps, buttons, cutlery, and/or writing: Denies   Review of Systems  Constitutional: Negative for fatigue, night sweats and weakness ( ).  HENT: Negative for mouth sores, mouth dryness and nose dryness.   Eyes: Negative for redness and dryness.  Respiratory: Negative for shortness of breath and difficulty breathing.   Cardiovascular: Negative for chest pain, palpitations, hypertension, irregular heartbeat and swelling in legs/feet.  Gastrointestinal: Negative for constipation and diarrhea.  Endocrine: Positive for increased urination.       During the night  Musculoskeletal:  Positive for arthralgias, joint pain and morning stiffness. Negative for joint swelling, myalgias, muscle weakness, muscle tenderness and myalgias.  Skin: Negative for color change, rash, hair loss, nodules/bumps, skin tightness, ulcers and sensitivity to sunlight.  Allergic/Immunologic: Negative for susceptible to infections.  Neurological: Negative for dizziness, fainting, memory loss and night sweats.  Hematological: Negative for swollen glands.  Psychiatric/Behavioral: Positive for sleep disturbance. Negative for depressed mood. The patient is not nervous/anxious.     PMFS History:  Patient Active Problem List   Diagnosis Date Noted  . Pain in both feet 09/06/2016  . Chronic midline low back pain 09/06/2016  . Chronic right shoulder pain 09/06/2016  . Difficulty hearing 03/05/2014  . Essential (primary) hypertension 10/21/2013  . Asthma with COPD (HCC) 07/20/2013  . Exercise-induced asthma 07/20/2013  . Gout 05/25/2013  . Airway hyperreactivity 01/31/2013  . Arthralgia of temporomandibular joint 01/13/2013    Past Medical History:  Diagnosis Date  . Asthma   . Gout   . Hypertension     Family History  Problem Relation Age of Onset  . Rheum arthritis Mother   . Allergies Father   . HIV/AIDS Brother   . Aneurysm Sister     Cerebral  . Diabetes Mellitus II Father    Past Surgical History:  Procedure Laterality Date  . NO PAST SURGERIES     Social History   Social History Narrative  . No narrative on  file     Objective: Vital Signs: BP 138/78   Pulse 76   Resp 16   Ht 5\' 9"  (1.753 m)   Wt 217 lb (98.4 kg)   BMI 32.05 kg/m    Physical Exam  Constitutional: He is oriented to person, place, and time. He appears well-developed and well-nourished.  HENT:  Head: Normocephalic and atraumatic.  Eyes: Conjunctivae and EOM are normal. Pupils are equal, round, and reactive to light.  Neck: Normal range of motion. Neck supple.  Cardiovascular: Normal rate, regular  rhythm and normal heart sounds.   Pulmonary/Chest: Effort normal and breath sounds normal.  Abdominal: Soft. Bowel sounds are normal.  Neurological: He is alert and oriented to person, place, and time.  Skin: Skin is warm and dry. Capillary refill takes less than 2 seconds.  Psychiatric: He has a normal mood and affect. His behavior is normal.  Nursing note and vitals reviewed.    Musculoskeletal Exam: C-spine, thoracic spine good range of motion lumbar spine he had some discomfort with range of motion mostly in the lumbar paraspinal region without radiculopathy. Shoulder joints, elbow joints, wrist joints, MCPs PIPs DIPs with good range of motion with no synovitis. Hip joints knee joints, ankle joints, MTPs PIPs with good range of motion he has some tenderness on palpation of his left ankle joint and across bilateral first MTP joints. No warmth or swelling was noted.  CDAI Exam: No CDAI exam completed.    Investigation: No additional findings.   Imaging: No results found.  Speciality Comments: No specialty comments available.    Procedures:  No procedures performed Allergies: Erythromycin; Other; Penicillins; and Proair hfa [albuterol]   Assessment / Plan:     Visit Diagnoses: Idiopathic chronic gout of foot without tophus, unspecified laterality -patient gives history of intermittent pain in his bilateral first MTP joints. He reports having flares of gout about twice a month the last episode was 2 weeks ago. He still have some tenderness over MTPs without any warmth or swelling. He is also having tenderness over left ankle joint. Detailed counseling regarding gout was provided. Dietary modification was discussed. I've advised to take colchicine one tablet by mouth daily I would like to start him on allopurinol after the lab work. [place] is going to [place] and would not be back until and of February. I discussed starting allopurinol after history to [place]. I do not have any recent labs  on him I will obtain CBC comprehensive uric acid rheumatoid factor and CCP today Plan: XR Foot 2 Views Right, XR Foot 2 Views Left, XR Ankle 2 Views Left x-rays were unremarkable except for bilateral first MTP narrowing. A perception refill for colchicine was given.  Chronic midline low back pain, with sciatica presence unspecified. Joint protection and muscle strengthening was discussed WITH a handout on back exercises - Plan: XR Lumbar Spine 2-3 Views. X-rays show some disc disease in the lumbar spine. He has some muscle spasm for which lumbar spine exercises were discussed.  Exercise-induced asthma  Essential (primary) hypertension  Chronic right shoulder pain, handout on shoulder joint exercises was given. - Plan: XR Shoulder Right    Orders: Orders Placed This Encounter  Procedures  . XR Foot 2 Views Right  . XR Foot 2 Views Left  . XR Ankle 2 Views Left  . XR Lumbar Spine 2-3 Views  . XR Shoulder Right  . CBC with Differential/Platelet  . COMPLETE METABOLIC PANEL WITH GFR  . Uric acid  . Rheumatoid  factor  . Cyclic citrul peptide antibody, IgG   No orders of the defined types were placed in this encounter.   Face-to-face time spent with patient was 50 minutes. 50% of time was spent in counseling and coordination of care.  Follow-Up Instructions: Return for Gout, lower back pain.   Pollyann Savoy, MD  Note - This record has been created using Animal nutritionist.  Chart creation errors have been sought, but may not always  have been located. Such creation errors do not reflect on  the standard of medical care.

## 2016-09-06 ENCOUNTER — Ambulatory Visit (INDEPENDENT_AMBULATORY_CARE_PROVIDER_SITE_OTHER): Payer: Medicare Other

## 2016-09-06 ENCOUNTER — Ambulatory Visit (INDEPENDENT_AMBULATORY_CARE_PROVIDER_SITE_OTHER): Payer: Self-pay

## 2016-09-06 ENCOUNTER — Encounter: Payer: Self-pay | Admitting: Rheumatology

## 2016-09-06 ENCOUNTER — Ambulatory Visit (INDEPENDENT_AMBULATORY_CARE_PROVIDER_SITE_OTHER): Payer: Medicare Other | Admitting: Rheumatology

## 2016-09-06 VITALS — BP 138/78 | HR 76 | Resp 16 | Ht 69.0 in | Wt 217.0 lb

## 2016-09-06 DIAGNOSIS — M25511 Pain in right shoulder: Secondary | ICD-10-CM

## 2016-09-06 DIAGNOSIS — M1A0790 Idiopathic chronic gout, unspecified ankle and foot, without tophus (tophi): Secondary | ICD-10-CM

## 2016-09-06 DIAGNOSIS — G8929 Other chronic pain: Secondary | ICD-10-CM

## 2016-09-06 DIAGNOSIS — J45990 Exercise induced bronchospasm: Secondary | ICD-10-CM

## 2016-09-06 DIAGNOSIS — M545 Low back pain, unspecified: Secondary | ICD-10-CM | POA: Insufficient documentation

## 2016-09-06 DIAGNOSIS — M79672 Pain in left foot: Secondary | ICD-10-CM

## 2016-09-06 DIAGNOSIS — I10 Essential (primary) hypertension: Secondary | ICD-10-CM | POA: Diagnosis not present

## 2016-09-06 DIAGNOSIS — M25572 Pain in left ankle and joints of left foot: Secondary | ICD-10-CM

## 2016-09-06 DIAGNOSIS — M79671 Pain in right foot: Secondary | ICD-10-CM | POA: Insufficient documentation

## 2016-09-06 DIAGNOSIS — M25571 Pain in right ankle and joints of right foot: Secondary | ICD-10-CM | POA: Diagnosis not present

## 2016-09-06 LAB — CBC WITH DIFFERENTIAL/PLATELET
Basophils Absolute: 0 cells/uL (ref 0–200)
Basophils Relative: 0 %
Eosinophils Absolute: 92 cells/uL (ref 15–500)
Eosinophils Relative: 2 %
HEMATOCRIT: 45 % (ref 38.5–50.0)
Hemoglobin: 14.7 g/dL (ref 13.2–17.1)
Lymphocytes Relative: 50 %
Lymphs Abs: 2300 cells/uL (ref 850–3900)
MCH: 27.6 pg (ref 27.0–33.0)
MCHC: 32.7 g/dL (ref 32.0–36.0)
MCV: 84.6 fL (ref 80.0–100.0)
MONO ABS: 368 {cells}/uL (ref 200–950)
MPV: 9.4 fL (ref 7.5–12.5)
Monocytes Relative: 8 %
NEUTROS PCT: 40 %
Neutro Abs: 1840 cells/uL (ref 1500–7800)
Platelets: 246 10*3/uL (ref 140–400)
RBC: 5.32 MIL/uL (ref 4.20–5.80)
RDW: 15 % (ref 11.0–15.0)
WBC: 4.6 10*3/uL (ref 3.8–10.8)

## 2016-09-06 MED ORDER — COLCHICINE 0.6 MG PO TABS: 0.6000 mg | ORAL_TABLET | Freq: Every day | ORAL | 1 refills | Status: DC

## 2016-09-06 NOTE — Patient Instructions (Addendum)
Gout Introduction Gout is painful swelling that can happen in some of your joints. Gout is a type of arthritis. This condition is caused by having too much uric acid in your body. Uric acid is a chemical that is made when your body breaks down substances called purines. If your body has too much uric acid, sharp crystals can form and build up in your joints. This causes pain and swelling. Gout attacks can happen quickly and be very painful (acute gout). Over time, the attacks can affect more joints and happen more often (chronic gout). Follow these instructions at home: During a Gout Attack  If directed, put ice on the painful area:  Put ice in a plastic bag.  Place a towel between your skin and the bag.  Leave the ice on for 20 minutes, 2-3 times a day.  Rest the joint as much as possible. If the joint is in your leg, you may be given crutches to use.  Raise (elevate) the painful joint above the level of your heart as often as you can.  Drink enough fluids to keep your pee (urine) clear or pale yellow.  Take over-the-counter and prescription medicines only as told by your doctor.  Do not drive or use heavy machinery while taking prescription pain medicine.  Follow instructions from your doctor about what you can or cannot eat and drink.  Return to your normal activities as told by your doctor. Ask your doctor what activities are safe for you. Avoiding Future Gout Attacks  Follow a low-purine diet as told by a specialist (dietitian) or your doctor. Avoid foods and drinks that have a lot of purines, such as:  Liver.  Kidney.  Anchovies.  Asparagus.  Herring.  Mushrooms  Mussels.  Beer.  Limit alcohol intake to no more than 1 drink a day for nonpregnant women and 2 drinks a day for men. One drink equals 12 oz of beer, 5 oz of wine, or 1 oz of hard liquor.  Stay at a healthy weight or lose weight if you are overweight. If you want to lose weight, talk with your doctor.  It is important that you do not lose weight too fast.  Start or continue an exercise plan as told by your doctor.  Drink enough fluids to keep your pee clear or pale yellow.  Take over-the-counter and prescription medicines only as told by your doctor.  Keep all follow-up visits as told by your doctor. This is important. Contact a doctor if:  You have another gout attack.  You still have symptoms of a gout attack after10 days of treatment.  You have problems (side effects) because of your medicines.  You have chills or a fever.  You have burning pain when you pee (urinate).  You have pain in your lower back or belly. Get help right away if:  You have very bad pain.  Your pain cannot be controlled.  You cannot pee. This information is not intended to replace advice given to you by your health care provider. Make sure you discuss any questions you have with your health care provider. Document Released: 05/22/2008 Document Revised: 01/19/2016 Document Reviewed: 05/26/2015  2017 Elsevier  Allopurinol tablets What is this medicine? ALLOPURINOL (al oh PURE i nole) reduces the amount of uric acid the body makes. It is used to treat the symptoms of gout. It is also used to treat or prevent high uric acid levels that occur as a result of certain types of chemotherapy. This  medicine may also help patients who frequently have kidney stones. This medicine may be used for other purposes; ask your health care provider or pharmacist if you have questions. COMMON BRAND NAME(S): Zyloprim What should I tell my health care provider before I take this medicine? They need to know if you have any of these conditions: -kidney or liver disease -an unusual or allergic reaction to allopurinol, other medicines, foods, dyes, or preservatives -pregnant or trying to get pregnant -breast feeding How should I use this medicine? Take this medicine by mouth with a glass of water. Follow the directions on  the prescription label. If this medicine upsets your stomach, take it with food or milk. Take your doses at regular intervals. Do not take your medicine more often than directed. Talk to your pediatrician regarding the use of this medicine in children. Special care may be needed. While this drug may be prescribed for children as young as 6 years for selected conditions, precautions do apply. Overdosage: If you think you have taken too much of this medicine contact a poison control center or emergency room at once. NOTE: This medicine is only for you. Do not share this medicine with others. What if I miss a dose? If you miss a dose, take it as soon as you can. If it is almost time for your next dose, take only that dose. Do not take double or extra doses. What may interact with this medicine? Do not take this medicine with the following medication: -didanosine, ddI This medicine may also interact with the following medications: -amoxicillin or ampicillin -azathioprine -certain medicines used to treat gout -certain types of diuretics -chlorpropamide -cyclosporine -dicumarol -mercaptopurine -tolbutamide -warfarin This list may not describe all possible interactions. Give your health care provider a list of all the medicines, herbs, non-prescription drugs, or dietary supplements you use. Also tell them if you smoke, drink alcohol, or use illegal drugs. Some items may interact with your medicine. What should I watch for while using this medicine? Visit your doctor or health care professional for regular checks on your progress. If you are taking this medicine to treat gout, you may not have less frequent attacks at first. Keep taking your medicine regularly and the attacks should get better within 2 to 6 weeks. Drink plenty of water (10 to 12 full glasses a day) while you are taking this medicine. This will help to reduce stomach upset and reduce the risk of getting gout or kidney stones. Call your  doctor or health care professional at once if you get a skin rash together with chills, fever, sore throat, or nausea and vomiting, if you have blood in your urine, or difficulty passing urine. Do not take vitamin C without asking your doctor or health care professional. Too much vitamin C can increase the chance of getting kidney stones. You may get drowsy or dizzy. Do not drive, use machinery, or do anything that needs mental alertness until you know how this drug affects you. Do not stand or sit up quickly, especially if you are an older patient. This reduces the risk of dizzy or fainting spells. Alcohol can make you more drowsy and dizzy. Alcohol can also increase the chance of stomach problems and increase the amount of uric acid in your blood. Avoid alcoholic drinks. What side effects may I notice from receiving this medicine? Side effects that you should report to your doctor or health care professional as soon as possible: -allergic reactions like skin rash, itching or  hives, swelling of the face, lips, or tongue -breathing problems -muscle aches or pains -redness, blistering, peeling or loosening of the skin, including inside the mouth Side effects that usually do not require medical attention (report to your doctor or health care professional if they continue or are bothersome): -changes in taste -diarrhea -indigestion -stomach pain or cramps This list may not describe all possible side effects. Call your doctor for medical advice about side effects. You may report side effects to FDA at 1-800-FDA-1088. Where should I keep my medicine? Keep out of the reach of children. Store at room temperature between 15 and 25 degrees C (59 and 77 degrees F). Protect from light and moisture. Throw away any unused medicine after the expiration date. NOTE: This sheet is a summary. It may not cover all possible information. If you have questions about this medicine, talk to your doctor, pharmacist, or  health care provider.  2017 Elsevier/Gold Standard (2008-02-16 14:26:54)  Colchicine tablets or capsules What is this medicine? COLCHICINE (KOL chi seen) is for joint pain and swelling due to attacks of acute gouty arthritis. The medicine is also used to treat familial Mediterranean fever. This medicine may be used for other purposes; ask your health care provider or pharmacist if you have questions. COMMON BRAND NAME(S): Colcrys, MITIGARE What should I tell my health care provider before I take this medicine? They need to know if you have any of these conditions: -anemia -blood disorders like leukemia or lymphoma -heart disease -immune system problems -intestinal disease -kidney disease -liver disease -muscle pain or weakness -take other medicines -stomach problems -an unusual or allergic reaction to colchicine, other medicines, lactose, foods, dyes, or preservatives -pregnant or trying to get pregnant -breast-feeding How should I use this medicine? Take this medicine by mouth with a full glass of water. Follow the directions on the prescription label. You can take it with or without food. If it upsets your stomach, take it with food. Take your medicine at regular intervals. Do not take your medicine more often than directed. A special MedGuide will be given to you by the pharmacist with each prescription and refill. Be sure to read this information carefully each time. Talk to your pediatrician regarding the use of this medicine in children. While this drug may be prescribed for children as young as 63 years old for selected conditions, precautions do apply. Patients over 26 years old may have a stronger reaction and need a smaller dose. Overdosage: If you think you have taken too much of this medicine contact a poison control center or emergency room at once. NOTE: This medicine is only for you. Do not share this medicine with others. What if I miss a dose? If you miss a dose, take  it as soon as you can. If it is almost time for your next dose, take only that dose. Do not take double or extra doses. What may interact with this medicine? Do not take this medicine with any of the following medications: -certain medicines for fungal infections like itraconazole This medicine may also interact with the following medications: -alcohol -certain medicines for cholesterol like atorvastatin -certain medicines for coughs and colds -certain medicines to help you breathe better -cyclosporine -digoxin -epinephrine -grapefruit or grapefruit juice -methenamine -other medicines for fungal infection -sodium bicarbonate -some antibiotics like clarithromycin, erythromycin, and telithromycin -some medicines for an irregular heartbeat or other heart problems -some medicines for cancer, like lapatinib and tamoxifen -some medicines for HIV This list may not describe  all possible interactions. Give your health care provider a list of all the medicines, herbs, non-prescription drugs, or dietary supplements you use. Also tell them if you smoke, drink alcohol, or use illegal drugs. Some items may interact with your medicine. What should I watch for while using this medicine? Visit your doctor or health care professional for regular checks on your progress. You may need periodic blood checks. Alcohol can increase the chance of getting stomach problems and gout attacks. Do not drink alcohol. What side effects may I notice from receiving this medicine? Side effects that you should report to your doctor or health care professional as soon as possible: -allergic reactions like skin rash, itching or hives, swelling of the face, lips, or tongue -fever, chills, or sore throat -muscle tenderness, pain, or weakness -numbness or tingling in hands or feet -unusual bleeding or bruising -unusually weak or tired -vomiting Side effects that usually do not require medical attention (report to your doctor  or health care professional if they continue or are bothersome): -diarrhea -hair loss -loss of appetite -stomach pain or nausea This list may not describe all possible side effects. Call your doctor for medical advice about side effects. You may report side effects to FDA at 1-800-FDA-1088. Where should I keep my medicine? Keep out of the reach of children. Store at room temperature between 15 and 30 degrees C (59 and 86 degrees F). Keep container tightly closed. Protect from light. Throw away any unused medicine after the expiration date. NOTE: This sheet is a summary. It may not cover all possible information. If you have questions about this medicine, talk to your doctor, pharmacist, or health care provider.  2017 Elsevier/Gold Standard (2013-02-09 16:48:38)  Shoulder Exercises Ask your health care provider which exercises are safe for you. Do exercises exactly as told by your health care provider and adjust them as directed. It is normal to feel mild stretching, pulling, tightness, or discomfort as you do these exercises, but you should stop right away if you feel sudden pain or your pain gets worse.Do not begin these exercises until told by your health care provider. RANGE OF MOTION EXERCISES  These exercises warm up your muscles and joints and improve the movement and flexibility of your shoulder. These exercises also help to relieve pain, numbness, and tingling. These exercises involve stretching your injured shoulder directly. Exercise A: Pendulum  1. Stand near a wall or a surface that you can hold onto for balance. 2. Bend at the waist and let your left / right arm hang straight down. Use your other arm to support you. Keep your back straight and do not lock your knees. 3. Relax your left / right arm and shoulder muscles, and move your hips and your trunk so your left / right arm swings freely. Your arm should swing because of the motion of your body, not because you are using your arm  or shoulder muscles. 4. Keep moving your body so your arm swings in the following directions, as told by your health care provider:  Side to side.  Forward and backward.  In clockwise and counterclockwise circles. 5. Continue each motion for __________ seconds, or for as long as told by your health care provider. 6. Slowly return to the starting position. Repeat __________ times. Complete this exercise __________ times a day. Exercise B:Flexion, Standing  1. Stand and hold a broomstick, a cane, or a similar object. Place your hands a little more than shoulder-width apart on the object.  Your left / right hand should be palm-up, and your other hand should be palm-down. 2. Keep your elbow straight and keep your shoulder muscles relaxed. Push the stick down with your healthy arm to raise your left / right arm in front of your body, and then over your head until you feel a stretch in your shoulder.  Avoid shrugging your shoulder while you raise your arm. Keep your shoulder blade tucked down toward the middle of your back. 3. Hold for __________ seconds. 4. Slowly return to the starting position. Repeat __________ times. Complete this exercise __________ times a day. Exercise C: Abduction, Standing 1. Stand and hold a broomstick, a cane, or a similar object. Place your hands a little more than shoulder-width apart on the object. Your left / right hand should be palm-up, and your other hand should be palm-down. 2. While keeping your elbow straight and your shoulder muscles relaxed, push the stick across your body toward your left / right side. Raise your left / right arm to the side of your body and then over your head until you feel a stretch in your shoulder.  Do not raise your arm above shoulder height, unless your health care provider tells you to do that.  Avoid shrugging your shoulder while you raise your arm. Keep your shoulder blade tucked down toward the middle of your back. 3. Hold for  __________ seconds. 4. Slowly return to the starting position. Repeat __________ times. Complete this exercise __________ times a day. Exercise D:Internal Rotation  1. Place your left / right hand behind your back, palm-up. 2. Use your other hand to dangle an exercise band, a towel, or a similar object over your shoulder. Grasp the band with your left / right hand so you are holding onto both ends. 3. Gently pull up on the band until you feel a stretch in the front of your left / right shoulder.  Avoid shrugging your shoulder while you raise your arm. Keep your shoulder blade tucked down toward the middle of your back. 4. Hold for __________ seconds. 5. Release the stretch by letting go of the band and lowering your hands. Repeat __________ times. Complete this exercise __________ times a day. STRETCHING EXERCISES  These exercises warm up your muscles and joints and improve the movement and flexibility of your shoulder. These exercises also help to relieve pain, numbness, and tingling. These exercises are done using your healthy shoulder to help stretch the muscles of your injured shoulder. Exercise E: [employer] officer, [employer] party (External Rotation and Abduction)  1. Stand in a doorway with one of your feet slightly in front of the other. This is called a staggered stance. If you cannot reach your forearms to the door frame, stand facing a corner of a room. 2. Choose one of the following positions as told by your health care provider:  Place your hands and forearms on the door frame above your head.  Place your hands and forearms on the door frame at the height of your head.  Place your hands on the door frame at the height of your elbows. 3. Slowly move your weight onto your front foot until you feel a stretch across your chest and in the front of your shoulders. Keep your head and chest upright and keep your abdominal muscles tight. 4. Hold for __________ seconds. 5. To release the stretch, shift your  weight to your back foot. Repeat __________ times. Complete this stretch __________ times a day. Exercise F:Extension, Standing 1.  Stand and hold a broomstick, a cane, or a similar object behind your back.  Your hands should be a little wider than shoulder-width apart.  Your palms should face away from your back. 2. Keeping your elbows straight and keeping your shoulder muscles relaxed, move the stick away from your body until you feel a stretch in your shoulder.  Avoid shrugging your shoulders while you move the stick. Keep your shoulder blade tucked down toward the middle of your back. 3. Hold for __________ seconds. 4. Slowly return to the starting position. Repeat __________ times. Complete this exercise __________ times a day. STRENGTHENING EXERCISES  These exercises build strength and endurance in your shoulder. Endurance is the ability to use your muscles for a long time, even after they get tired. Exercise G:External Rotation  1. Sit in a stable chair without armrests. 2. Secure an exercise band at elbow height on your left / right side. 3. Place a soft object, such as a folded towel or a small pillow, between your left / right upper arm and your body to move your elbow a few inches away (about 10 cm) from your side. 4. Hold the end of the band so it is tight and there is no slack. 5. Keeping your elbow pressed against the soft object, move your left / right forearm out, away from your abdomen. Keep your body steady so only your forearm moves. 6. Hold for __________ seconds. 7. Slowly return to the starting position. Repeat __________ times. Complete this exercise __________ times a day. Exercise H:Shoulder Abduction  1. Sit in a stable chair without armrests, or stand. 2. Hold a __________ weight in your left / right hand, or hold an exercise band with both hands. 3. Start with your arms straight down and your left / right palm facing in, toward your body. 4. Slowly lift your  left / right hand out to your side. Do not lift your hand above shoulder height unless your health care provider tells you that this is safe.  Keep your arms straight.  Avoid shrugging your shoulder while you do this movement. Keep your shoulder blade tucked down toward the middle of your back. 5. Hold for __________ seconds. 6. Slowly lower your arm, and return to the starting position. Repeat __________ times. Complete this exercise __________ times a day. Exercise I:Shoulder Extension 1. Sit in a stable chair without armrests, or stand. 2. Secure an exercise band to a stable object in front of you where it is at shoulder height. 3. Hold one end of the exercise band in each hand. Your palms should face each other. 4. Straighten your elbows and lift your hands up to shoulder height. 5. Step back, away from the secured end of the exercise band, until the band is tight and there is no slack. 6. Squeeze your shoulder blades together as you pull your hands down to the sides of your thighs. Stop when your hands are straight down by your sides. Do not let your hands go behind your body. 7. Hold for __________ seconds. 8. Slowly return to the starting position. Repeat __________ times. Complete this exercise __________ times a day. Exercise J:Standing Shoulder Row 1. Sit in a stable chair without armrests, or stand. 2. Secure an exercise band to a stable object in front of you so it is at waist height. 3. Hold one end of the exercise band in each hand. Your palms should be in a thumbs-up position. 4. Bend each of your elbows  to an "L" shape (about 90 degrees) and keep your upper arms at your sides. 5. Step back until the band is tight and there is no slack. 6. Slowly pull your elbows back behind you. 7. Hold for __________ seconds. 8. Slowly return to the starting position. Repeat __________ times. Complete this exercise __________ times a day. Exercise K:Shoulder Press-Ups  1. Sit in a  stable chair that has armrests. Sit upright, with your feet flat on the floor. 2. Put your hands on the armrests so your elbows are bent and your fingers are pointing forward. Your hands should be about even with the sides of your body. 3. Push down on the armrests and use your arms to lift yourself off of the chair. Straighten your elbows and lift yourself up as much as you comfortably can.  Move your shoulder blades down, and avoid letting your shoulders move up toward your ears.  Keep your feet on the ground. As you get stronger, your feet should support less of your body weight as you lift yourself up. 4. Hold for __________ seconds. 5. Slowly lower yourself back into the chair. Repeat __________ times. Complete this exercise __________ times a day. Exercise L: Wall Push-Ups  1. Stand so you are facing a stable wall. Your feet should be about one arm-length away from the wall. 2. Lean forward and place your palms on the wall at shoulder height. 3. Keep your feet flat on the floor as you bend your elbows and lean forward toward the wall. 4. Hold for __________ seconds. 5. Straighten your elbows to push yourself back to the starting position. Repeat __________ times. Complete this exercise __________ times a day. This information is not intended to replace advice given to you by your health care provider. Make sure you discuss any questions you have with your health care provider. Document Released: 06/27/2005 Document Revised: 05/07/2016 Document Reviewed: 04/24/2015 [employer] Interactive Patient Education  2017 [employer]  Back Exercises The following exercises strengthen the muscles that help to support the back. They also help to keep the lower back flexible. Doing these exercises can help to prevent back pain or lessen existing pain. If you have back pain or discomfort, try doing these exercises 2-3 times each day or as told by your health care provider. When the pain goes away, do  them once each day, but increase the number of times that you repeat the steps for each exercise (do more repetitions). If you do not have back pain or discomfort, do these exercises once each day or as told by your health care provider. Exercises Single Knee to Chest  Repeat these steps 3-5 times for each leg: 1. Lie on your back on a firm bed or the floor with your legs extended. 2. Bring one knee to your chest. Your other leg should stay extended and in contact with the floor. 3. Hold your knee in place by grabbing your knee or thigh. 4. Pull on your knee until you feel a gentle stretch in your lower back. 5. Hold the stretch for 10-30 seconds. 6. Slowly release and straighten your leg. Pelvic Tilt  Repeat these steps 5-10 times: 1. Lie on your back on a firm bed or the floor with your legs extended. 2. Bend your knees so they are pointing toward the ceiling and your feet are flat on the floor. 3. Tighten your lower abdominal muscles to press your lower back against the floor. This motion will tilt your pelvis so  your tailbone points up toward the ceiling instead of pointing to your feet or the floor. 4. With gentle tension and even breathing, hold this position for 5-10 seconds. Cat-Cow  Repeat these steps until your lower back becomes more flexible: 1. Get into a hands-and-knees position on a firm surface. Keep your hands under your shoulders, and keep your knees under your hips. You may place padding under your knees for comfort. 2. Let your head hang down, and point your tailbone toward the floor so your lower back becomes rounded like the back of a cat. 3. Hold this position for 5 seconds. 4. Slowly lift your head and point your tailbone up toward the ceiling so your back forms a sagging arch like the back of a cow. 5. Hold this position for 5 seconds. Press-Ups  Repeat these steps 5-10 times: 1. Lie on your abdomen (face-down) on the floor. 2. Place your palms near your head, about  shoulder-width apart. 3. While you keep your back as relaxed as possible and keep your hips on the floor, slowly straighten your arms to raise the top half of your body and lift your shoulders. Do not use your back muscles to raise your upper torso. You may adjust the placement of your hands to make yourself more comfortable. 4. Hold this position for 5 seconds while you keep your back relaxed. 5. Slowly return to lying flat on the floor. Bridges  Repeat these steps 10 times: 1. Lie on your back on a firm surface. 2. Bend your knees so they are pointing toward the ceiling and your feet are flat on the floor. 3. Tighten your buttocks muscles and lift your buttocks off of the floor until your waist is at almost the same height as your knees. You should feel the muscles working in your buttocks and the back of your thighs. If you do not feel these muscles, slide your feet 1-2 inches farther away from your buttocks. 4. Hold this position for 3-5 seconds. 5. Slowly lower your hips to the starting position, and allow your buttocks muscles to relax completely. If this exercise is too easy, try doing it with your arms crossed over your chest. Abdominal Crunches  Repeat these steps 5-10 times: 1. Lie on your back on a firm bed or the floor with your legs extended. 2. Bend your knees so they are pointing toward the ceiling and your feet are flat on the floor. 3. Cross your arms over your chest. 4. Tip your chin slightly toward your chest without bending your neck. 5. Tighten your abdominal muscles and slowly raise your trunk (torso) high enough to lift your shoulder blades a tiny bit off of the floor. Avoid raising your torso higher than that, because it can put too much stress on your low back and it does not help to strengthen your abdominal muscles. 6. Slowly return to your starting position. Back Lifts  Repeat these steps 5-10 times: 8. Lie on your abdomen (face-down) with your arms at your sides,  and rest your forehead on the floor. 9. Tighten the muscles in your legs and your buttocks. 10. Slowly lift your chest off of the floor while you keep your hips pressed to the floor. Keep the back of your head in line with the curve in your back. Your eyes should be looking at the floor. 11. Hold this position for 3-5 seconds. 12. Slowly return to your starting position. Contact a health care provider if:  Your back pain  or discomfort gets much worse when you do an exercise.  Your back pain or discomfort does not lessen within 2 hours after you exercise. If you have any of these problems, stop doing these exercises right away. Do not do them again unless your health care provider says that you can. Get help right away if:  You develop sudden, severe back pain. If this happens, stop doing the exercises right away. Do not do them again unless your health care provider says that you can. This information is not intended to replace advice given to you by your health care provider. Make sure you discuss any questions you have with your health care provider. Document Released: 09/20/2004 Document Revised: 12/21/2015 Document Reviewed: 10/07/2014 Elsevier Interactive Patient Education  2017 [employer].

## 2016-09-06 NOTE — Progress Notes (Signed)
Plan per Dr. Corliss Skainseveshwar:  "I've advised to take colchicine one tablet by mouth daily I would like to start him on allopurinol after the lab work. [place] going to [place] and would not be back until and of February. I discussed starting allopurinol after history to [place]."    Counseled patient on purpose, proper use, and adverse effects of allopurinol and colchicine.  Discussed the importance of taking allopurinol every day to lower uric acid levels. The possibility of recurrent gout while lowering the uric acid was explained and discussed the importance of taking colchicine daily at this time.  Medication education was provided to patient and questions were answered.    Connor Palmer, Pharm.D., BCPS Clinical Pharmacist Pager: [phone] Phone: [phone] 09/06/2016 12:03 PM

## 2016-09-07 LAB — COMPLETE METABOLIC PANEL WITH GFR
ALBUMIN: 4.1 g/dL (ref 3.6–5.1)
ALT: 13 U/L (ref 9–46)
AST: 19 U/L (ref 10–35)
Alkaline Phosphatase: 64 U/L (ref 40–115)
BUN: 11 mg/dL (ref 7–25)
CALCIUM: 9.3 mg/dL (ref 8.6–10.3)
CO2: 27 mmol/L (ref 20–31)
Chloride: 101 mmol/L (ref 98–110)
Creat: 1.07 mg/dL (ref 0.70–1.25)
GFR, EST AFRICAN AMERICAN: 84 mL/min (ref >=60)
GFR, Est Non African American: 72 mL/min (ref >=60)
Glucose, Bld: 83 mg/dL (ref 65–99)
Potassium: 4.2 mmol/L (ref 3.5–5.3)
Sodium: 136 mmol/L (ref 135–146)
Total Bilirubin: 0.6 mg/dL (ref 0.2–1.2)
Total Protein: 6.9 g/dL (ref 6.1–8.1)

## 2016-09-07 LAB — CYCLIC CITRUL PEPTIDE ANTIBODY, IGG: Cyclic Citrullin Peptide Ab: <16 Units

## 2016-09-07 LAB — URIC ACID: Uric Acid, Serum: 7.1 mg/dL (ref 4.0–8.0)

## 2016-09-07 LAB — RHEUMATOID FACTOR: Rhuematoid fact SerPl-aCnc: <14 IU/mL (ref <14)

## 2016-09-07 NOTE — Progress Notes (Signed)
Labs within normal limits

## 2016-10-09 NOTE — Progress Notes (Signed)
Office Visit Note  Patient: Connor Palmer             Date of Birth: 25-Jan-1951           MRN: [id number]             PCP: Bartholome Bill, MD Referring: Bartholome Bill, MD Visit Date: 10/18/2016 Occupation: _0 @    Subjective:  Follow up on Gout   History of Present Illness: Connor Palmer is a 66 y.o. male with history of chronic gout. According to patient he has not been taking any colchicine and he has not had any flare of gout. Most of the gout flares have been in his feet in the past. Right shoulder has been better. Lower back pain is better after doing exercises.   Activities of Daily Living:  Patient reports morning stiffness for15  minutes.   Patient Denies nocturnal pain.  Difficulty dressing/grooming: Denies Difficulty climbing stairs: Denies Difficulty getting out of chair: Denies Difficulty using hands for taps, buttons, cutlery, and/or writing: Denies   Review of Systems  Constitutional: Positive for fatigue. Negative for night sweats and weakness ( ).  HENT: Negative for mouth sores, mouth dryness and nose dryness.   Eyes: Negative for redness and dryness.  Respiratory: Negative for shortness of breath and difficulty breathing.   Cardiovascular: Negative for chest pain, palpitations, hypertension, irregular heartbeat and swelling in legs/feet.  Gastrointestinal: Negative for constipation and diarrhea.  Endocrine: Negative for increased urination.  Musculoskeletal: Negative for joint swelling, myalgias, muscle weakness, morning stiffness, muscle tenderness and myalgias.  Skin: Negative for color change, rash, hair loss, nodules/bumps, skin tightness, ulcers and sensitivity to sunlight.  Allergic/Immunologic: Negative for susceptible to infections.  Neurological: Negative for dizziness, fainting, memory loss and night sweats.  Hematological: Negative for swollen glands.  Psychiatric/Behavioral: Negative for depressed mood and sleep disturbance.  The patient is not nervous/anxious.     PMFS History:  Patient Active Problem List   Diagnosis Date Noted  . Pain in both feet 09/06/2016  . Chronic midline low back pain 09/06/2016  . Chronic right shoulder pain 09/06/2016  . Difficulty hearing 03/05/2014  . Essential (primary) hypertension 10/21/2013  . Asthma with COPD ([place]) 07/20/2013  . Exercise-induced asthma 07/20/2013  . Gout 05/25/2013  . Airway hyperreactivity 01/31/2013  . Arthralgia of temporomandibular joint 01/13/2013    Past Medical History:  Diagnosis Date  . Asthma   . Gout   . Hypertension     Family History  Problem Relation Age of Onset  . Rheum arthritis Mother   . Allergies Father   . HIV/AIDS Brother   . Aneurysm Sister     Cerebral  . Diabetes Mellitus II Father    Past Surgical History:  Procedure Laterality Date  . NO PAST SURGERIES     Social History   Social History Narrative  . No narrative on file     Objective: Vital Signs: There were no vitals taken for this visit.   Physical Exam  Constitutional: He is oriented to person, place, and time. He appears well-developed and well-nourished.  HENT:  Head: Normocephalic and atraumatic.  Eyes: Conjunctivae and EOM are normal. Pupils are equal, round, and reactive to light.  Neck: Normal range of motion. Neck supple.  Cardiovascular: Normal rate, regular rhythm and normal heart sounds.   Pulmonary/Chest: Effort normal and breath sounds normal.  Abdominal: Soft. Bowel sounds are normal.  Neurological: He is alert and oriented to person, place, and time.  Skin: Skin is warm and dry. Capillary refill takes less than 2 seconds.  Psychiatric: He has a normal mood and affect. His behavior is normal.  Nursing note and vitals reviewed.    Musculoskeletal Exam: C-spine and thoracic lumbar spine good range of motion shoulders elbow joints wrist joint MCPs PIPs DIPs with good range of motion with no synovitis hip joints knee joints ankles MTPs  PIPs with good range of motion with no swelling or synovitis.  CDAI Exam: No CDAI exam completed.    Investigation: No additional findings.  Office Visit on 09/06/2016  Component Date Value Ref Range Status  . WBC 09/06/2016 4.6  3.8 - 10.8 K/uL Final  . RBC 09/06/2016 5.32  4.20 - 5.80 MIL/uL Final  . Hemoglobin 09/06/2016 14.7  13.2 - 17.1 g/dL Final  . HCT 09/06/2016 45.0  38.5 - 50.0 % Final  . MCV 09/06/2016 84.6  80.0 - 100.0 fL Final  . MCH 09/06/2016 27.6  27.0 - 33.0 pg Final  . MCHC 09/06/2016 32.7  32.0 - 36.0 g/dL Final  . RDW 09/06/2016 15.0  11.0 - 15.0 % Final  . Platelets 09/06/2016 246  140 - 400 K/uL Final  . MPV 09/06/2016 9.4  7.5 - 12.5 fL Final  . Neutro Abs 09/06/2016 1840  1,500 - 7,800 cells/uL Final  . Lymphs Abs 09/06/2016 2300  850 - 3,900 cells/uL Final  . Monocytes Absolute 09/06/2016 368  200 - 950 cells/uL Final  . Eosinophils Absolute 09/06/2016 92  15 - 500 cells/uL Final  . Basophils Absolute 09/06/2016 0  0 - 200 cells/uL Final  . Neutrophils Relative % 09/06/2016 40  % Final  . Lymphocytes Relative 09/06/2016 50  % Final  . Monocytes Relative 09/06/2016 8  % Final  . Eosinophils Relative 09/06/2016 2  % Final  . Basophils Relative 09/06/2016 0  % Final  . Smear Review 09/06/2016 Criteria for review not met   Final  . Sodium 09/06/2016 136  135 - 146 mmol/L Final  . Potassium 09/06/2016 4.2  3.5 - 5.3 mmol/L Final  . Chloride 09/06/2016 101  98 - 110 mmol/L Final  . CO2 09/06/2016 27  20 - 31 mmol/L Final  . Glucose, Bld 09/06/2016 83  65 - 99 mg/dL Final  . BUN 09/06/2016 11  7 - 25 mg/dL Final  . Creat 09/06/2016 1.07  0.70 - 1.25 mg/dL Final   Comment:   For patients > or = 66 years of age: The upper reference limit for Creatinine is approximately 13% higher for people identified as African-American.     . Total Bilirubin 09/06/2016 0.6  0.2 - 1.2 mg/dL Final  . Alkaline Phosphatase 09/06/2016 64  40 - 115 U/L Final  . AST  09/06/2016 19  10 - 35 U/L Final  . ALT 09/06/2016 13  9 - 46 U/L Final  . Total Protein 09/06/2016 6.9  6.1 - 8.1 g/dL Final  . Albumin 09/06/2016 4.1  3.6 - 5.1 g/dL Final  . Calcium 09/06/2016 9.3  8.6 - 10.3 mg/dL Final  . GFR, Est African American 09/06/2016 84  >=60 mL/min Final  . GFR, Est Non African American 09/06/2016 72  >=60 mL/min Final  . Uric Acid, Serum 09/06/2016 7.1  4.0 - 8.0 mg/dL Final  . Rhuematoid fact SerPl-aCnc 09/06/2016 <14  <14 IU/mL Final  . Cyclic Citrullin Peptide Ab 09/06/2016 <16  Units Final   Comment:   Reference Range Negative               <  20 Weak Positive            20 - 39 Moderate Positive        40 - 59 Strong Positive        > 59    Imaging: No results found.  Speciality Comments: No specialty comments available.    Procedures:  No procedures performed Allergies: Erythromycin; Other; Penicillins; and Proair hfa [albuterol]   Assessment / Plan:     Visit Diagnoses: Chronic right shoulder pain: Doing better  Chronic midline low back pain without sciatica: His symptoms of improved since she's been doing some lumbar spine exercises which were discussed and demonstrated last visit.  Idiopathic chronic gout of multiple sites without tophus: He has not had any gout flare since the last visit. Is also not taking any colchicine. We had detailed discussion regarding use of allopurinol as he has had quite frequent flares about twice a month. His uric acid is not very high 80s made some dietary adjustments. I've advised him to take colchicine 0.6 mg a day for the time. Until his uric acid is below 6. I'll start him on allopurinol 100 mg by mouth daily and increase it to 200 mg after a month and then 300 mg a day. I'll check a CBC, CMP and uric acid in 3 months. Indications side effects contraindications of allopurinol were discussed at length. I've advised him in case she develops a rash while he is taking allopurinol he should discontinue the  medication.  History of asthma  History of hypertension    Orders: No orders of the defined types were placed in this encounter.  No orders of the defined types were placed in this encounter.   Face-to-face time spent with patient was 30 minutes. 50% of time was spent in counseling and coordination of care.  Follow-Up Instructions: No Follow-up on file.   Amy Littrell, RT  Note - This record has been created using [employer].  Chart creation errors have been sought, but may not always  have been located. Such creation errors do not reflect on  the standard of medical care.

## 2016-10-18 ENCOUNTER — Encounter: Payer: Self-pay | Admitting: Rheumatology

## 2016-10-18 ENCOUNTER — Ambulatory Visit (INDEPENDENT_AMBULATORY_CARE_PROVIDER_SITE_OTHER): Payer: Medicare Other | Admitting: Rheumatology

## 2016-10-18 VITALS — BP 140/76 | HR 76 | Resp 14 | Wt 211.0 lb

## 2016-10-18 DIAGNOSIS — M1A09X0 Idiopathic chronic gout, multiple sites, without tophus (tophi): Secondary | ICD-10-CM

## 2016-10-18 DIAGNOSIS — G8929 Other chronic pain: Secondary | ICD-10-CM | POA: Diagnosis not present

## 2016-10-18 DIAGNOSIS — M545 Low back pain, unspecified: Secondary | ICD-10-CM

## 2016-10-18 DIAGNOSIS — Z8709 Personal history of other diseases of the respiratory system: Secondary | ICD-10-CM | POA: Diagnosis not present

## 2016-10-18 DIAGNOSIS — Z5181 Encounter for therapeutic drug level monitoring: Secondary | ICD-10-CM | POA: Insufficient documentation

## 2016-10-18 DIAGNOSIS — M25511 Pain in right shoulder: Secondary | ICD-10-CM | POA: Diagnosis not present

## 2016-10-18 DIAGNOSIS — Z8679 Personal history of other diseases of the circulatory system: Secondary | ICD-10-CM | POA: Insufficient documentation

## 2016-10-18 MED ORDER — ALLOPURINOL 100 MG PO TABS: ORAL_TABLET | ORAL | 0 refills | Status: DC

## 2016-10-18 NOTE — Patient Instructions (Addendum)
Take allopurinol 100 mg daily for one month, then increase to 200 mg (2 tablets) daily for one month, then increase to 300 mg (3 tablets) daily.    Take colchicine 0.6 mg daily.    We will need labs in three months.    Allopurinol tablets What is this medicine? ALLOPURINOL (al oh PURE i nole) reduces the amount of uric acid the body makes. It is used to treat the symptoms of gout. It is also used to treat or prevent high uric acid levels that occur as a result of certain types of chemotherapy. This medicine may also help patients who frequently have kidney stones. This medicine may be used for other purposes; ask your health care provider or pharmacist if you have questions. COMMON BRAND NAME(S): Zyloprim What should I tell my health care provider before I take this medicine? They need to know if you have any of these conditions: -kidney or liver disease -an unusual or allergic reaction to allopurinol, other medicines, foods, dyes, or preservatives -pregnant or trying to get pregnant -breast feeding How should I use this medicine? Take this medicine by mouth with a glass of water. Follow the directions on the prescription label. If this medicine upsets your stomach, take it with food or milk. Take your doses at regular intervals. Do not take your medicine more often than directed. Talk to your pediatrician regarding the use of this medicine in children. Special care may be needed. While this drug may be prescribed for children as young as 6 years for selected conditions, precautions do apply. Overdosage: If you think you have taken too much of this medicine contact a poison control center or emergency room at once. NOTE: This medicine is only for you. Do not share this medicine with others. What if I miss a dose? If you miss a dose, take it as soon as you can. If it is almost time for your next dose, take only that dose. Do not take double or extra doses. What may interact with this  medicine? Do not take this medicine with the following medication: -didanosine, ddI This medicine may also interact with the following medications: -amoxicillin or ampicillin -azathioprine -certain medicines used to treat gout -certain types of diuretics -chlorpropamide -cyclosporine -dicumarol -mercaptopurine -tolbutamide -warfarin This list may not describe all possible interactions. Give your health care provider a list of all the medicines, herbs, non-prescription drugs, or dietary supplements you use. Also tell them if you smoke, drink alcohol, or use illegal drugs. Some items may interact with your medicine. What should I watch for while using this medicine? Visit your doctor or health care professional for regular checks on your progress. If you are taking this medicine to treat gout, you may not have less frequent attacks at first. Keep taking your medicine regularly and the attacks should get better within 2 to 6 weeks. Drink plenty of water (10 to 12 full glasses a day) while you are taking this medicine. This will help to reduce stomach upset and reduce the risk of getting gout or kidney stones. Call your doctor or health care professional at once if you get a skin rash together with chills, fever, sore throat, or nausea and vomiting, if you have blood in your urine, or difficulty passing urine. Do not take vitamin C without asking your doctor or health care professional. Too much vitamin C can increase the chance of getting kidney stones. You may get drowsy or dizzy. Do not drive, use machinery, or do  anything that needs mental alertness until you know how this drug affects you. Do not stand or sit up quickly, especially if you are an older patient. This reduces the risk of dizzy or fainting spells. Alcohol can make you more drowsy and dizzy. Alcohol can also increase the chance of stomach problems and increase the amount of uric acid in your blood. Avoid alcoholic drinks. What side  effects may I notice from receiving this medicine? Side effects that you should report to your doctor or health care professional as soon as possible: -allergic reactions like skin rash, itching or hives, swelling of the face, lips, or tongue -breathing problems -muscle aches or pains -redness, blistering, peeling or loosening of the skin, including inside the mouth Side effects that usually do not require medical attention (report to your doctor or health care professional if they continue or are bothersome): -changes in taste -diarrhea -indigestion -stomach pain or cramps This list may not describe all possible side effects. Call your doctor for medical advice about side effects. You may report side effects to FDA at 1-800-FDA-1088. Where should I keep my medicine? Keep out of the reach of children. Store at room temperature between 15 and 25 degrees C (59 and 77 degrees F). Protect from light and moisture. Throw away any unused medicine after the expiration date. NOTE: This sheet is a summary. It may not cover all possible information. If you have questions about this medicine, talk to your doctor, pharmacist, or health care provider.  2017 Elsevier/Gold Standard (2008-02-16 14:26:54)

## 2016-10-18 NOTE — Progress Notes (Signed)
Pharmacy Note  Subjective: Patient presents today to the [facility] to see Dr. Corliss Skainseveshwar.  Patient was prescribed allopurinol for gout.  Patient was seen by the pharmacist for counseling on allopurinol and colchicine.  Objective: CBC    Component Value Date/Time   WBC 4.6 09/06/2016 1059   RBC 5.32 09/06/2016 1059   HGB 14.7 09/06/2016 1059   HCT 45.0 09/06/2016 1059   PLT 246 09/06/2016 1059   MCV 84.6 09/06/2016 1059   MCH 27.6 09/06/2016 1059   MCHC 32.7 09/06/2016 1059   RDW 15.0 09/06/2016 1059   LYMPHSABS 2,300 09/06/2016 1059   MONOABS 368 09/06/2016 1059   EOSABS 92 09/06/2016 1059   BASOSABS 0 09/06/2016 1059   CMP     Component Value Date/Time   NA 136 09/06/2016 1059   K 4.2 09/06/2016 1059   CL 101 09/06/2016 1059   CO2 27 09/06/2016 1059   GLUCOSE 83 09/06/2016 1059   BUN 11 09/06/2016 1059   CREATININE 1.07 09/06/2016 1059   CALCIUM 9.3 09/06/2016 1059   PROT 6.9 09/06/2016 1059   ALBUMIN 4.1 09/06/2016 1059   AST 19 09/06/2016 1059   ALT 13 09/06/2016 1059   ALKPHOS 64 09/06/2016 1059   BILITOT 0.6 09/06/2016 1059   GFRNONAA 72 09/06/2016 1059   GFRAA 84 09/06/2016 1059   Uric Acid:  7.1 mg/dL (4/09/811/11/18)  Assessment/Plan:  Counseled patient on the purpose proper use, and adverse effects of allopurinol and colchicine.  Discussed the importance of taking allopurinol every day to lower uric acid levels.  Reviewed with patient the instructions to take allopurinol 100 mg daily for one month, then increase to 200 mg daily for one month, then increase to 300 mg daily.  The possibility of recurrent gout while lowering the uric acid was explained and discussed the importance of taking colchicine daily at this time.  Colchicine prescription was sent in January.  Patient confirms he has the medication at home.  Provided patient with medication education material and answered all questions.    Lilla Shookachel Liberty Seto, Pharm.D., BCPS, CPP Clinical  Pharmacist Pager: [phone] Phone: [phone] 10/18/2016 11:14 AM

## 2017-01-09 NOTE — Progress Notes (Deleted)
Office Visit Note  Patient: Connor Palmer             Date of Birth: 05/28/1951           MRN: [id number]             PCP: Verlon Au, MD Referring: Verlon Au, MD Visit Date: 01/17/2017 Occupation: @GUAROCC @    Subjective:  No chief complaint on file.   History of Present Illness: Connor Palmer is a 66 y.o. male ***   Activities of Daily Living:  Patient reports morning stiffness for *** {minute/hour:19697}.   Patient {ACTIONS;DENIES/REPORTS:21021675::"Denies"} nocturnal pain.  Difficulty dressing/grooming: {ACTIONS;DENIES/REPORTS:21021675::"Denies"} Difficulty climbing stairs: {ACTIONS;DENIES/REPORTS:21021675::"Denies"} Difficulty getting out of chair: {ACTIONS;DENIES/REPORTS:21021675::"Denies"} Difficulty using hands for taps, buttons, cutlery, and/or writing: {ACTIONS;DENIES/REPORTS:21021675::"Denies"}   No Rheumatology ROS completed.   PMFS History:  Patient Active Problem List   Diagnosis Date Noted  . History of asthma 10/18/2016  . History of hypertension 10/18/2016  . Medication monitoring encounter 10/18/2016  . Pain in both feet 09/06/2016  . Chronic midline low back pain 09/06/2016  . Chronic right shoulder pain 09/06/2016  . Difficulty hearing 03/05/2014  . Essential (primary) hypertension 10/21/2013  . Asthma with COPD (HCC) 07/20/2013  . Exercise-induced asthma 07/20/2013  . Gout 05/25/2013  . Airway hyperreactivity 01/31/2013  . Arthralgia of temporomandibular joint 01/13/2013    Past Medical History:  Diagnosis Date  . Asthma   . Gout   . Hypertension     Family History  Problem Relation Age of Onset  . Rheum arthritis Mother   . Allergies Father   . HIV/AIDS Brother   . Aneurysm Sister        Cerebral  . Diabetes Mellitus II Father    Past Surgical History:  Procedure Laterality Date  . NO PAST SURGERIES     Social History   Social History Narrative  . No narrative on file     Objective: Vital Signs:  There were no vitals taken for this visit.   Physical Exam   Musculoskeletal Exam: ***  CDAI Exam: No CDAI exam completed.    Investigation: No additional findings. CBC Latest Ref Rng & Units 09/06/2016 07/24/2014  WBC 3.8 - 10.8 K/uL 4.6 4.3  Hemoglobin 13.2 - 17.1 g/dL 40.9 81.1  Hematocrit 91.4 - 50.0 % 45.0 43.9  Platelets 140 - 400 K/uL 246 233   CMP Latest Ref Rng & Units 09/06/2016 07/24/2014  Glucose 65 - 99 mg/dL 83 88  BUN 7 - 25 mg/dL 11 12  Creatinine 7.82 - 1.25 mg/dL 9.56 2.13  Sodium 086 - 146 mmol/L 136 138  Potassium 3.5 - 5.3 mmol/L 4.2 4.1  Chloride 98 - 110 mmol/L 101 101  CO2 20 - 31 mmol/L 27 28  Calcium 8.6 - 10.3 mg/dL 9.3 9.0  Total Protein 6.1 - 8.1 g/dL 6.9 6.7  Total Bilirubin 0.2 - 1.2 mg/dL 0.6 0.5  Alkaline Phos 40 - 115 U/L 64 74  AST 10 - 35 U/L 19 20  ALT 9 - 46 U/L 13 18    Imaging: No results found.  Speciality Comments: No specialty comments available.    Procedures:  No procedures performed Allergies: Erythromycin; Other; Penicillins; and Proair hfa [albuterol]   Assessment / Plan:     Visit Diagnoses: Idiopathic chronic gout of multiple sites without tophus  Medication monitoring encounter - Allopurinol, colchicine  Bilateral temporomandibular joint pain  Pain in both feet  Chronic right shoulder pain  Chronic midline low back  pain without sciatica  History of hypertension  History of asthma    Orders: No orders of the defined types were placed in this encounter.  No orders of the defined types were placed in this encounter.   Face-to-face time spent with patient was *** minutes. 50% of time was spent in counseling and coordination of care.  Follow-Up Instructions: No Follow-up on file.   Pollyann SavoyShaili Konner Warrior, MD  Note - This record has been created using Animal nutritionistDragon software.  Chart creation errors have been sought, but may not always  have been located. Such creation errors do not reflect on  the standard of  medical care.

## 2017-01-15 ENCOUNTER — Ambulatory Visit: Payer: Medicare Other | Admitting: Rheumatology

## 2017-01-17 ENCOUNTER — Ambulatory Visit: Payer: Medicare Other | Admitting: Rheumatology

## 2017-02-11 ENCOUNTER — Ambulatory Visit (INDEPENDENT_AMBULATORY_CARE_PROVIDER_SITE_OTHER): Payer: Medicare Other | Admitting: Pulmonary Disease

## 2017-02-11 ENCOUNTER — Encounter: Payer: Self-pay | Admitting: Pulmonary Disease

## 2017-02-11 VITALS — BP 130/74 | HR 82 | Ht 70.0 in | Wt 202.8 lb

## 2017-02-11 DIAGNOSIS — J449 Chronic obstructive pulmonary disease, unspecified: Secondary | ICD-10-CM

## 2017-02-11 MED ORDER — ALBUTEROL SULFATE (2.5 MG/3ML) 0.083% IN NEBU: 2.5000 mg | INHALATION_SOLUTION | Freq: Four times a day (QID) | RESPIRATORY_TRACT | 5 refills | Status: AC | PRN

## 2017-02-11 NOTE — Progress Notes (Signed)
Current Outpatient Prescriptions on File Prior to Visit  Medication Sig  . albuterol (PROVENTIL HFA;VENTOLIN HFA) 108 (90 Base) MCG/ACT inhaler Inhale into the lungs.  [name]. amLODipine-benazepril (LOTREL) 10-40 MG capsule Take by mouth.  . cholecalciferol (VITAMIN D) 1000 units tablet Take 2,000 Units by mouth daily.  . colchicine (COLCRYS) 0.6 MG tablet Take 1 tablet (0.6 mg total) by mouth daily. May take 1 tablet by mouth twice a day when necessary flares  . Fluticasone-Salmeterol (ADVAIR DISKUS) 250-50 MCG/DOSE AEPB Inhale 1 puff into the lungs 2 (two) times daily.  . VENTOLIN HFA 108 (90 Base) MCG/ACT inhaler INHALE 1 PUFF INTO THE LUNGS EVERY 6 (SIX) HOURS AS NEEDED FOR WHEEZING OR SHORTNESS OF BREATH.  [name]. VITAMIN A PO Take 1 tablet by mouth daily.   No current facility-administered medications on file prior to visit.      Chief Complaint  Patient presents with  . Follow-up    Last seen 2016. [facility] URI. Pt states that since d/c his breathing has been good. He notes some chest congestion that he is unable to get up.      Pulmonary tests Spirometry 03/05/14 >> FEV1 1.06 (34%), FEV1% 43 PFT 04/08/14 >> Pre FEV1 0.99 (34%)/FEV1% 42, Post FEV1 1.28 (45%)/FEV1% 49, TLC 5.65 (85%), DLCO 110%  Past medical history Gout  Past surgical history, Family history, Social history, Allergies all reviewed.  Vital Signs BP 130/74 (BP Location: Right Arm, Cuff Size: Normal)   Pulse 82   Ht 5\' 10"  (1.778 m)   Wt 202 lb 12.8 oz (92 kg)   SpO2 96%   BMI 29.10 kg/m   History of Present Illness Connor Palmer is a 66 y.o. 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  I last saw him in 2016.  [place] and [place].  He started getting more cough and wheeze.  He was getting sputum, but was hard to bring up.  He had to go to ER.  He was given neb tx, steroids.  He has improved.  He uses advair once per day.  He uses ventolin  1 or 2 times per week.    He is not currently having wheeze, fever, chest pain, rash, or sinus congestion.  He still gets cough and chest rattle, but hard to bring up sputum.  Physical Exam  General - No distress ENT - No sinus tenderness, no oral exudate, no LAN Cardiac - s1s2 regular, no murmur Chest - No wheeze/rales/dullness Back - No focal tenderness Abd - Soft, non-tender Ext - No edema Neuro - Normal strength Skin - No rashes Psych - normal mood, and behavior   Assessment/Plan  COPD with asthma. - continue advair one puff daily with prn albuterol - will arrange for home nebulizer   Patient Instructions  Will arrange for home nebulizer  Follow up in 1 year    Coralyn HellingVineet Jaedin Regina, MD [place] [facility]/Critical Care/Sleep Pager:  [phone] 02/11/2017, 3:36 PM

## 2017-02-11 NOTE — Patient Instructions (Signed)
Will arrange for home nebulizer  Follow up in 1 year 

## 2017-02-13 ENCOUNTER — Other Ambulatory Visit: Payer: Self-pay | Admitting: Pulmonary Disease

## 2018-03-03 ENCOUNTER — Ambulatory Visit (INDEPENDENT_AMBULATORY_CARE_PROVIDER_SITE_OTHER): Payer: Medicare Other | Admitting: Pulmonary Disease

## 2018-03-03 ENCOUNTER — Encounter: Payer: Self-pay | Admitting: Pulmonary Disease

## 2018-03-03 VITALS — BP 150/102 | HR 78 | Ht 69.0 in | Wt 210.0 lb

## 2018-03-03 DIAGNOSIS — J449 Chronic obstructive pulmonary disease, unspecified: Secondary | ICD-10-CM

## 2018-03-03 NOTE — Patient Instructions (Signed)
Follow up in 1 year.

## 2018-03-03 NOTE — Progress Notes (Signed)
Pulmonary, Critical Care, and Sleep Medicine  Chief Complaint  Patient presents with  . Follow-up    Pt doing well overall.    Constitutional: BP (!) 150/102 (BP Location: Left Arm, Cuff Size: Normal)   Pulse 78   Ht 5\' 9"  (1.753 m)   Wt 210 lb (95.3 kg)   SpO2 98%   BMI 31.01 kg/m   History of Present Illness: Connor Palmer is a 67 y.o. male former smoker with COPD and asthma.   He uses advair once per day, but sometimes twice per day.  Hasn't needed albuterol much.  Denies recentcough, wheeze, sputum, chest pain, fever, skin rash, chest pain, hemoptysis, joint swelling.  Sleeps through the night w/o breathing difficulty.  Gets bloated occasionally if he eats too much.  [place] is [place] to [place] graduation present.  Comprehensive Respiratory Exam:  Appearance - well kempt  ENMT - nasal mucosa moist, turbinates clear, midline nasal septum, wears dentures, no gingival bleeding, no oral exudates, no tonsillar hypertrophy Neck - no masses, trachea midline, no thyromegaly, no elevation in JVP Respiratory - normal appearance of chest wall, normal respiratory effort w/o accessory muscle use, no dullness on percussion, no tactile fremitus, no wheezing or rales CV - s1s2 regular rate and rhythm, no murmurs, no peripheral edema, no varicosities, radial pulses symmetric GI - soft, non tender, no masses, no hepatosplenomegaly Lymph - no adenopathy noted in neck and axillary areas MSK - normal muscle strength and tone, normal gait Ext - no cyanosis, clubbing, or joint inflammation noted Skin - no rashes, lesions, or ulcers Neuro - oriented to person, place, and time Psych - normal mood and affect   Assessment/Plan:  COPD with asthma. - continue advair and prn albuterol  Hypertension. - blood pressure is high today - advised him to monitor and follow up with his PCP   Patient Instructions  Follow up in 1 year    Coralyn HellingVineet Misti Towle, MD [facility]  Pulmonary/Critical Care 03/03/2018, 4:42 PM  Flow Sheet  Pulmonary tests: Spirometry 03/05/14>>FEV1 1.06 (34%), FEV1% 43 PFT 04/08/14>>Pre FEV1 0.99 (34%)/FEV1% 42, Post FEV1 1.28 (45%)/FEV1% 49, TLC 5.65 (85%), DLCO 110%  Past Medical History: He  has a past medical history of Asthma, Gout, and Hypertension.  Past Surgical History: No prior surgeries  Family History: His family history includes Allergies in his father; Aneurysm in his sister; Diabetes Mellitus II in his father; HIV/AIDS in his brother; Rheum arthritis in his mother.  Social History: He  reports that he quit smoking about 30 years ago. His smoking use included cigarettes. He has a 0.75 pack-year smoking history. He has never used smokeless tobacco. He reports that he does not use drugs.  Medications: Allergies as of 03/03/2018      Reactions   Erythromycin    Rash, bumps   Other    Pistachio, Cashews.....   Rash    Penicillins    unknown   Proair Hfa [albuterol]       Medication List        Accurate as of 03/03/18  4:42 PM. Always use your most recent med list.          amLODipine-benazepril 10-40 MG capsule Commonly known as:  LOTREL Take by mouth.   cholecalciferol 1000 units tablet Commonly known as:  VITAMIN D Take 2,000 Units by mouth daily.   colchicine 0.6 MG tablet Commonly known as:  COLCRYS Take 1 tablet (0.6 mg total) by mouth daily. May take 1 tablet  by mouth twice a day when necessary flares   Fluticasone-Salmeterol 250-50 MCG/DOSE Aepb Commonly known as:  ADVAIR DISKUS Inhale 1 puff into the lungs 2 (two) times daily.   albuterol 108 (90 Base) MCG/ACT inhaler Commonly known as:  PROVENTIL HFA;VENTOLIN HFA Inhale into the lungs.   VENTOLIN HFA 108 (90 Base) MCG/ACT inhaler Generic drug:  albuterol INHALE 1 PUFF INTO THE LUNGS EVERY 6 (SIX) HOURS AS NEEDED FOR WHEEZING OR SHORTNESS OF BREATH.   albuterol (2.5 MG/3ML) 0.083% nebulizer solution Commonly known as:  PROVENTIL Take  3 mLs (2.5 mg total) by nebulization every 6 (six) hours as needed for wheezing or shortness of breath.   VITAMIN A PO Take 1 tablet by mouth daily.

## 2018-09-01 ENCOUNTER — Ambulatory Visit (INDEPENDENT_AMBULATORY_CARE_PROVIDER_SITE_OTHER): Payer: Medicare Other | Admitting: Nurse Practitioner

## 2018-09-01 ENCOUNTER — Encounter: Payer: Self-pay | Admitting: Nurse Practitioner

## 2018-09-01 ENCOUNTER — Telehealth: Payer: Self-pay | Admitting: Pulmonary Disease

## 2018-09-01 DIAGNOSIS — J449 Chronic obstructive pulmonary disease, unspecified: Secondary | ICD-10-CM

## 2018-09-01 MED ORDER — NYSTATIN 100000 UNIT/ML MT SUSP
5.0000 mL | Freq: Four times a day (QID) | OROMUCOSAL | 0 refills | Status: DC
Start: 2018-09-01 — End: 2024-07-10

## 2018-09-01 MED ORDER — ADVAIR DISKUS 250-50 MCG/DOSE IN AEPB: 1.0000 | INHALATION_SPRAY | Freq: Two times a day (BID) | RESPIRATORY_TRACT | 5 refills | Status: DC

## 2018-09-01 MED ORDER — FLUTICASONE-SALMETEROL 250-50 MCG/DOSE IN AEPB: 1.0000 | INHALATION_SPRAY | Freq: Two times a day (BID) | RESPIRATORY_TRACT | 1 refills | Status: DC

## 2018-09-01 NOTE — Progress Notes (Signed)
Reviewed and agree with assessment/plan.   Ruhi Kopke, MD [place] [facility]/Critical Care 08/22/2016, 12:24 PM Pager:  [phone]  

## 2018-09-01 NOTE — Patient Instructions (Signed)
Will reorder Advair - patient tried and failed wixela - throat irritation and shortness of breath Will order nystatin for oral thrush Continue Advair 1 puff twice daily Please follow up with Dr. Craige Cotta at his first available appointment for routine follow up or sooner if needed

## 2018-09-01 NOTE — Progress Notes (Signed)
 @Patient  ID: Connor Palmer, male    DOB: 1951/06/14, 68 y.o.   MRN: [id number]  Chief Complaint  Patient presents with  . Shortness of Breath    with cough    Referring provider: Verlon AuBoyd, Tammy Lamonica, MD  HPI 68 year old male former smoker COPD and asthma followed by Dr. Craige CottaSood.  Tests:  PFT Results Latest Ref Rng & Units 04/08/2014  FVC-Pre L 2.37  FVC-Predicted Pre % 64  FVC-Post L 2.62  FVC-Predicted Post % 71  Pre FEV1/FVC % % 42  Post FEV1/FCV % % 49  FEV1-Pre L 0.99  FEV1-Predicted Pre % 34  FEV1-Post L 1.28  DLCO UNC% % 110  DLCO COR %Predicted % 122   OV 09/01/18 - shortness of breath/throat irritation Presents with shortness of breath and slight throat irritation.  He states his symptoms started about 2 weeks ago when his Advair was changed to TatumsWixela by the pharmacy.  He states that he cannot tolerate Wixela.  He states that since he has started using it he has had throat irritation and feels like it does not work because he is having shortness of breath.  He states that he does rinse his mouth after using inhalers.  He denies any fever chest congestion.  He denies any sinus congestion.  He denies any chest pain or edema.     Allergies  Allergen Reactions  . Erythromycin     Rash, bumps  . Other     Pistachio, Cashews.....   Rash   . Penicillins     unknown  . Proair Hfa [Albuterol]     Immunization History  Administered Date(s) Administered  . Influenza,inj,Quad PF,6+ Mos 08/31/2013  . Pneumococcal Polysaccharide-23 05/08/2012  . Typhoid Live 01/07/2015  . Yellow Fever 01/07/2015    Past Medical History:  Diagnosis Date  . Asthma   . Gout   . Hypertension     Tobacco History: Social History   Tobacco Use  Smoking Status Former Smoker  . Packs/day: 0.25  . Years: 3.00  . Pack years: 0.75  . Types: Cigarettes  . Last attempt to quit: 08/29/1987  . Years since quitting: 31.0  Smokeless Tobacco Never Used   Counseling given:  Yes   Outpatient Encounter Medications as of 09/01/2018  Medication Sig  . albuterol (PROVENTIL HFA;VENTOLIN HFA) 108 (90 Base) MCG/ACT inhaler Inhale into the lungs.  [name]. albuterol (PROVENTIL) (2.5 MG/3ML) 0.083% nebulizer solution Take 3 mLs (2.5 mg total) by nebulization every 6 (six) hours as needed for wheezing or shortness of breath.  . cholecalciferol (VITAMIN D) 1000 units tablet Take 2,000 Units by mouth daily.  . colchicine (COLCRYS) 0.6 MG tablet Take 1 tablet (0.6 mg total) by mouth daily. May take 1 tablet by mouth twice a day when necessary flares  . VENTOLIN HFA 108 (90 Base) MCG/ACT inhaler INHALE 1 PUFF INTO THE LUNGS EVERY 6 (SIX) HOURS AS NEEDED FOR WHEEZING OR SHORTNESS OF BREATH.  [name]. VITAMIN A PO Take 1 tablet by mouth daily.  [name]. ADVAIR DISKUS 250-50 MCG/DOSE AEPB Inhale 1 puff into the lungs 2 (two) times daily.  [name]. amLODipine-benazepril (LOTREL) 10-40 MG capsule Take by mouth.  . nystatin (MYCOSTATIN) 100000 UNIT/ML suspension Take 5 mLs (500,000 Units total) by mouth 4 (four) times daily.  . [DISCONTINUED] Fluticasone-Salmeterol (ADVAIR DISKUS) 250-50 MCG/DOSE AEPB Inhale 1 puff into the lungs 2 (two) times daily. (Patient not taking: Reported on 09/01/2018)  . [DISCONTINUED] Fluticasone-Salmeterol (ADVAIR DISKUS) 250-50 MCG/DOSE AEPB Inhale 1 puff into  the lungs 2 (two) times daily.   No facility-administered encounter medications on file as of 09/01/2018.      Review of Systems  Review of Systems  Constitutional: Negative.  Negative for chills and fever.  HENT: Negative.   Respiratory: Positive for shortness of breath. Negative for cough and wheezing.   Cardiovascular: Negative.  Negative for chest pain, palpitations and leg swelling.  Gastrointestinal: Negative.   Allergic/Immunologic: Negative.   Neurological: Negative.   Psychiatric/Behavioral: Negative.        Physical Exam  BP 138/80 (BP Location: Left Arm, Patient Position: Sitting, Cuff Size: Normal)    Pulse 76   Ht 5\' 9"  (1.753 m)   Wt 211 lb 6.4 oz (95.9 kg)   SpO2 97%   BMI 31.22 kg/m   Wt Readings from Last 5 Encounters:  09/01/18 211 lb 6.4 oz (95.9 kg)  03/03/18 210 lb (95.3 kg)  02/11/17 202 lb 12.8 oz (92 kg)  10/18/16 211 lb (95.7 kg)  09/06/16 217 lb (98.4 kg)     Physical Exam Vitals signs and nursing note reviewed.  Constitutional:      General: He is not in acute distress.    Appearance: He is well-developed.  HENT:     Mouth/Throat:     Comments: Oral thrush noted Cardiovascular:     Rate and Rhythm: Normal rate and regular rhythm.  Pulmonary:     Effort: Pulmonary effort is normal.     Breath sounds: Normal breath sounds. No decreased breath sounds, wheezing or rhonchi.  Skin:    General: Skin is warm and dry.  Neurological:     Mental Status: He is alert and oriented to person, place, and time.        Assessment & Plan:   Asthma with COPD Will reorder Advair with note to pharmacy that patient cannot tolerate Wixela.   Patient Instructions  Will reorder Advair - patient tried and failed wixela - throat irritation and shortness of breath Will order nystatin for oral thrush Continue Advair 1 puff twice daily Please follow up with Dr. Craige Cotta at his first available appointment for routine follow up or sooner if needed       Ivonne Andrew, NP 09/01/2018

## 2018-09-01 NOTE — Assessment & Plan Note (Signed)
Will reorder Advair with note to pharmacy that patient cannot tolerate Wixela.   Patient Instructions  Will reorder Advair - patient tried and failed wixela - throat irritation and shortness of breath Will order nystatin for oral thrush Continue Advair 1 puff twice daily Please follow up with Dr. Craige Cotta at his first available appointment for routine follow up or sooner if needed

## 2018-09-01 NOTE — Telephone Encounter (Signed)
Spoke with Patient in lobby.  He was recently changed from Advair to the generic, Wixella.  He is having increased cough, sore throat, unable to sleep, and doesn't feel that his breathing is the same. Patient's Wife stated that they were willing to see NP. Appointment scheduled with Archie Patten, NP at 1145 today.  Nothing further at this time.

## 2018-11-03 ENCOUNTER — Ambulatory Visit (INDEPENDENT_AMBULATORY_CARE_PROVIDER_SITE_OTHER): Payer: Medicare Other | Admitting: Pulmonary Disease

## 2018-11-03 ENCOUNTER — Encounter: Payer: Self-pay | Admitting: Pulmonary Disease

## 2018-11-03 VITALS — BP 132/84 | HR 74 | Ht 70.0 in | Wt 218.0 lb

## 2018-11-03 DIAGNOSIS — J449 Chronic obstructive pulmonary disease, unspecified: Secondary | ICD-10-CM | POA: Diagnosis not present

## 2018-11-03 NOTE — Progress Notes (Signed)
[name], Critical Care, and Sleep Medicine  Chief Complaint  Patient presents with  . Follow-up    Doing well today     Constitutional:  BP 132/84 (BP Location: Left Arm, Patient Position: Sitting, Cuff Size: Normal)   Pulse 74   Ht 5\' 10"  (1.778 m)   Wt 218 lb (98.9 kg)   SpO2 98%   BMI 31.28 kg/m   Past Medical History:  Gout, Hypertension.  Brief Summary:  Connor Palmer is a 68 y.o. male former smoker with COPD and asthma.  He was changed to wixela for insurance.  Had more throat irritation and cough.  Back on advair.  Uses daily.  Not having cough, wheeze, chest congestion, or dyspnea.  Not needing albuterol.  Physical Exam:   Appearance - well kempt   ENMT - clear nasal mucosa, midline nasal  septum, no oral exudates, no LAN, trachea midline, wears dentures  Respiratory - normal chest wall, normal respiratory effort, no accessory muscle use, no wheeze/rales  CV - s1s2 regular rate and rhythm, no murmurs, no peripheral edema, radial pulses symmetric  GI - soft, non tender, no masses  Lymph - no adenopathy noted in neck and axillary areas  MSK - normal gait  Ext - no cyanosis, clubbing, or joint inflammation noted  Skin - no rashes, lesions, or ulcers  Neuro - normal strength, oriented x 3  Psych - normal mood and affect  Assessment/Plan:   COPD with asthma. - uses advair one puff daily - prn albuterol - repeat testing if his symptoms progress   Patient Instructions  Follow up in 6 months    Connor Helling, MD [facility]/Critical Care Pager: [phone] 11/03/2018, 1:53 PM  Flow Sheet     Pulmonary tests:  Spirometry 03/05/14>>FEV1 1.06 (34%), FEV1% 43 PFT 04/08/14>>Pre FEV1 0.99 (34%)/FEV1% 42, Post FEV1 1.28 (45%)/FEV1% 49, TLC 5.65 (85%), DLCO 110%  Medications:   Allergies as of 11/03/2018      Reactions   Erythromycin    Rash, bumps   Other    Pistachio, Cashews.....   Rash    Penicillins    unknown   Proair Hfa [albuterol]       Medication List       Accurate as of November 03, 2018  1:53 PM. Always use your most recent med list.        Advair Diskus 250-50 MCG/DOSE Aepb Generic drug:  Fluticasone-Salmeterol Inhale 1 puff into the lungs 2 (two) times daily.   amLODipine-benazepril 10-40 MG capsule Commonly known as:  LOTREL Take by mouth.   cholecalciferol 1000 units tablet Commonly known as:  VITAMIN D Take 2,000 Units by mouth daily.   colchicine 0.6 MG tablet Commonly known as:  Colcrys Take 1 tablet (0.6 mg total) by mouth daily. May take 1 tablet by mouth twice a day when necessary flares   nystatin 100000 UNIT/ML suspension Commonly known as:  MYCOSTATIN Take 5 mLs (500,000 Units total) by mouth 4 (four) times daily.   albuterol 108 (90 Base) MCG/ACT inhaler Commonly known as:  PROVENTIL HFA;VENTOLIN HFA Inhale into the lungs.   Ventolin HFA 108 (90 Base) MCG/ACT inhaler Generic drug:  albuterol INHALE 1 PUFF INTO THE LUNGS EVERY 6 (SIX) HOURS AS NEEDED FOR WHEEZING OR SHORTNESS OF BREATH.   albuterol (2.5 MG/3ML) 0.083% nebulizer solution Commonly known as:  PROVENTIL Take 3 mLs (2.5 mg total) by nebulization every 6 (six) hours as needed for wheezing or shortness of breath.   VITAMIN A  PO Take 1 tablet by mouth daily.       Past Surgical History:  He  has a past surgical history that includes No past surgeries.  Family History:  His family history includes Allergies in his father; Aneurysm in his sister; Diabetes Mellitus II in his father; HIV/AIDS in his brother; Rheum arthritis in his mother.  Social History:  He  reports that he quit smoking about 31 years ago. His smoking use included cigarettes. He has a 0.75 pack-year smoking history. He has never used smokeless tobacco. He reports that he does not use drugs.

## 2018-11-03 NOTE — Patient Instructions (Signed)
Follow up in 6 months 

## 2019-01-05 ENCOUNTER — Other Ambulatory Visit: Payer: Self-pay | Admitting: Nurse Practitioner

## 2020-01-20 ENCOUNTER — Other Ambulatory Visit: Payer: Self-pay | Admitting: Pulmonary Disease

## 2020-10-25 ENCOUNTER — Ambulatory Visit (INDEPENDENT_AMBULATORY_CARE_PROVIDER_SITE_OTHER): Payer: Medicare Other

## 2020-10-25 ENCOUNTER — Ambulatory Visit (INDEPENDENT_AMBULATORY_CARE_PROVIDER_SITE_OTHER): Payer: Medicare Other | Admitting: Orthopaedic Surgery

## 2020-10-25 ENCOUNTER — Encounter: Payer: Self-pay | Admitting: Orthopaedic Surgery

## 2020-10-25 ENCOUNTER — Other Ambulatory Visit: Payer: Self-pay

## 2020-10-25 VITALS — BP 153/81 | HR 64 | Ht 70.0 in | Wt 205.0 lb

## 2020-10-25 DIAGNOSIS — M25552 Pain in left hip: Secondary | ICD-10-CM | POA: Diagnosis not present

## 2020-10-25 NOTE — Progress Notes (Signed)
Office Visit Note   Patient: Connor Palmer           Date of Birth: [birth year]           MRN: [id number] Visit Date: 10/25/2020              Requested by: Verlon Au, MD [address] [place],  [place] [address] PCP: Verlon Au, MD   Assessment & Plan: Visit Diagnoses:  1. Pain in left hip     Plan: Patient had lumbar x-ray 6 years ago and had some narrowing of the hip joint over the last 6 years with development of some marginal osteophytes consistent with mild OA.  No true locking.  Pain does not bother him on a daily basis does not limit his community ambulation and he has not been limping.  If he has progression of symptoms he can return.  Old and current x-rays were reviewed with patient and discussed.  Follow-Up Instructions: Return if symptoms worsen or fail to improve.   Orders:  Orders Placed This Encounter  Procedures  . XR HIP UNILAT W OR W/O PELVIS 2-3 VIEWS LEFT   No orders of the defined types were placed in this encounter.     Procedures: No procedures performed   Clinical Data: No additional findings.   Subjective: Chief Complaint  Patient presents with  . Left Hip - Pain    HPI 70 year old male reestablished patient seen with left groin pain that bothers him principally at night.  Does not seem to be related to how much walking he does during the day.  Pain is not constant.  Occasional with turning his felt some catching within the left groin.  Sometimes it feels a little bit stiff.  Symptoms been present for 6 to 7 months and are stable.  No problems with the opposite right side no numbness or tingling in his feet no bowel bladder symptoms.  Patient does have gout and takes colchicine.  No recent gout flares.  Review of Systems positive for asthma COPD no current wheezing or airway problems.  History of gout, back pain hypertension.  Other systems are updated and are noncontributory to HPI.   Objective: Vital  Signs: BP (!) 153/81   Pulse 64   Ht 5\' 10"  (1.778 m)   Wt 205 lb (93 kg)   BMI 29.41 kg/m   Physical Exam Constitutional:      Appearance: He is well-developed and well-nourished.  HENT:     Head: Normocephalic and atraumatic.  Eyes:     Extraocular Movements: EOM normal.     Pupils: Pupils are equal, round, and reactive to light.  Neck:     Thyroid: No thyromegaly.     Trachea: No tracheal deviation.  Cardiovascular:     Rate and Rhythm: Normal rate.  Pulmonary:     Effort: Pulmonary effort is normal.     Breath sounds: No wheezing.  Abdominal:     General: Bowel sounds are normal.     Palpations: Abdomen is soft.  Skin:    General: Skin is warm and dry.     Capillary Refill: Capillary refill takes less than 2 seconds.  Neurological:     Mental Status: He is alert and oriented to person, place, and time.  Psychiatric:        Mood and Affect: Mood and affect normal.        Behavior: Behavior normal.  Thought Content: Thought content normal.        Judgment: Judgment normal.     Ortho Exam patient has minimal discomfort with internal/external rotation right left hip 30 to 40 degrees symmetrical.  No hip flexion contracture mild discomfort with FABER test.  Distal pulses are intact no knee effusion.  Minimal trochanteric bursal tenderness no sciatic notch tenderness.  Specialty Comments:  No specialty comments available.  Imaging: XR HIP UNILAT W OR W/O PELVIS 2-3 VIEWS LEFT  Result Date: 10/25/2020 Standing AP pelvis including hips and frog-leg left hip obtained and reviewed.  This shows mild joint space narrowing left hip more than right with marginal osteophytes.  Negative for AVN.  No acute changes. Impression: Mild left hip osteoarthritis.  Negative for acute changes.    PMFS History: Patient Active Problem List   Diagnosis Date Noted  . History of asthma 10/18/2016  . History of hypertension 10/18/2016  . Medication monitoring encounter 10/18/2016  .  Pain in both feet 09/06/2016  . Chronic midline low back pain 09/06/2016  . Chronic right shoulder pain 09/06/2016  . Difficulty hearing 03/05/2014  . Essential (primary) hypertension 10/21/2013  . Asthma with COPD (HCC) 07/20/2013  . Exercise-induced asthma 07/20/2013  . Gout 05/25/2013  . Airway hyperreactivity 01/31/2013  . Arthralgia of temporomandibular joint 01/13/2013   Past Medical History:  Diagnosis Date  . Asthma   . Gout   . Hypertension     Family History  Problem Relation Age of Onset  . Rheum arthritis Mother   . Allergies Father   . Diabetes Mellitus II Father   . HIV/AIDS Brother   . Aneurysm Sister        Cerebral    Past Surgical History:  Procedure Laterality Date  . NO PAST SURGERIES     Social History   Occupational History  . Not on file  Tobacco Use  . Smoking status: Former Smoker    Packs/day: 0.25    Years: 3.00    Pack years: 0.75    Types: Cigarettes    Quit date: 08/29/1987    Years since quitting: 33.1  . Smokeless tobacco: Never Used  Vaping Use  . Vaping Use: Never used  Substance and Sexual Activity  . Alcohol use: Not on file  . Drug use: No  . Sexual activity: Not on file

## 2021-03-06 ENCOUNTER — Other Ambulatory Visit: Payer: Self-pay | Admitting: Pulmonary Disease

## 2021-03-29 NOTE — Progress Notes (Signed)
@Patient  ID: , male    DOB: 08/27/1951, 70 y.o.   MRN: [id number]  Chief Complaint  Patient presents with   Follow-up    Doing well    Referring provider: [id number], MD  HPI: 70 year old male, former smoker. PMH significant for asthma with COPD, HTN. Patient of Dr. 66, last seen on 11/03/18. Maintained on Advair diskus (he did not tolerate [place] d/t throat irritation). Recommend repeat spirometry testing if symptoms progress.   03/31/2021- Interim hx  Patient presents today for overdue follow-up/asthma with COPD. He is doing well today. He reports being a little bit more short of breath than he was previously. He has not been keeping up with his daily walking routine. He is compliant with Advair 250-38mcg one puff twice a day as prescribed. He uses his Ventolin inhaler no more than once a day. SABA helps when needed. Denies cough, chest tightness, wheezing or leg swelling.    Testing: Spirometry 03/05/14 >> FEV1 1.06 (34%), FEV1% 43 PFT 04/08/14 >> Pre FEV1 0.99 (34%)/FEV1% 42, Post FEV1 1.28 (45%)/FEV1% 49, TLC 5.65 (85%), DLCO 110%  Allergies  Allergen Reactions   Other Swelling    Pistachio, Cashews.....   Rash    Dust Mite Extract Other (See Comments)    Sinus issues   Erythromycin     Rash, bumps   Penicillins     unknown   Proair Hfa [Albuterol]     Immunization History  Administered Date(s) Administered   Influenza,inj,Quad PF,6+ Mos 08/31/2013   Influenza,inj,quad, With Preservative 08/31/2013   PFIZER(Purple Top)SARS-COV-2 Vaccination 12/01/2019, 12/23/2019   Pneumococcal Polysaccharide-23 05/08/2012   Typhoid Live 01/07/2015   Yellow Fever 01/07/2015    Past Medical History:  Diagnosis Date   Asthma    Gout    Hypertension     Tobacco History: Social History   Tobacco Use  Smoking Status Former   Packs/day: 0.25   Years: 3.00   Pack years: 0.75   Types: Cigarettes   Quit date: 08/29/1987   Years since quitting: 33.6   Smokeless Tobacco Never   Counseling given: Yes   Outpatient Medications Prior to Visit  Medication Sig Dispense Refill   ADVAIR DISKUS 250-50 MCG/ACT AEPB TAKE 1 PUFF BY MOUTH TWICE A DAY 180 each 2   albuterol (PROVENTIL HFA;VENTOLIN HFA) 108 (90 Base) MCG/ACT inhaler Inhale into the lungs.     albuterol (PROVENTIL) (2.5 MG/3ML) 0.083% nebulizer solution Take 3 mLs (2.5 mg total) by nebulization every 6 (six) hours as needed for wheezing or shortness of breath. 120 vial 5   cholecalciferol (VITAMIN D) 1000 units tablet Take 2,000 Units by mouth daily.     colchicine (COLCRYS) 0.6 MG tablet Take 1 tablet (0.6 mg total) by mouth daily. May take 1 tablet by mouth twice a day when necessary flares 100 tablet 1   nystatin (MYCOSTATIN) 100000 UNIT/ML suspension Take 5 mLs (500,000 Units total) by mouth 4 (four) times daily. 60 mL 0   VITAMIN A PO Take 1 tablet by mouth daily.     VENTOLIN HFA 108 (90 Base) MCG/ACT inhaler INHALE 1 PUFF INTO THE LUNGS EVERY 6 (SIX) HOURS AS NEEDED FOR WHEEZING OR SHORTNESS OF BREATH. 1 Inhaler 0   amLODipine-benazepril (LOTREL) 10-40 MG capsule Take by mouth.     No facility-administered medications prior to visit.   Review of Systems  Review of Systems  Constitutional: Negative.   HENT: Negative.    Respiratory:  Positive for shortness of  breath. Negative for cough, chest tightness and wheezing.   Cardiovascular: Negative.     Physical Exam  BP 132/82 (BP Location: Left Arm, Cuff Size: Normal)   Pulse 82   Temp (!) 97.2 F (36.2 C) (Oral)   Ht 5\' 10"  (1.778 m)   Wt 212 lb 3.2 oz (96.3 kg)   SpO2 98%   BMI 30.45 kg/m  Physical Exam Constitutional:      Appearance: Normal appearance.  HENT:     Head: Normocephalic and atraumatic.  Cardiovascular:     Rate and Rhythm: Normal rate and regular rhythm.     Comments: Regularly irregular; No LE edema Pulmonary:     Breath sounds: No wheezing or rhonchi.     Comments: CTA Neurological:      General: No focal deficit present.     Mental Status: He is alert and oriented to person, place, and time. Mental status is at baseline.  Psychiatric:        Mood and Affect: Mood normal.        Behavior: Behavior normal.        Thought Content: Thought content normal.        Judgment: Judgment normal.     Lab Results:  CBC    Component Value Date/Time   WBC 4.6 09/06/2016 1059   RBC 5.32 09/06/2016 1059   HGB 14.7 09/06/2016 1059   HCT 45.0 09/06/2016 1059   PLT 246 09/06/2016 1059   MCV 84.6 09/06/2016 1059   MCH 27.6 09/06/2016 1059   MCHC 32.7 09/06/2016 1059   RDW 15.0 09/06/2016 1059   LYMPHSABS 2,300 09/06/2016 1059   MONOABS 368 09/06/2016 1059   EOSABS 92 09/06/2016 1059   BASOSABS 0 09/06/2016 1059    BMET    Component Value Date/Time   NA 136 09/06/2016 1059   K 4.2 09/06/2016 1059   CL 101 09/06/2016 1059   CO2 27 09/06/2016 1059   GLUCOSE 83 09/06/2016 1059   BUN 11 09/06/2016 1059   CREATININE 1.07 09/06/2016 1059   CALCIUM 9.3 09/06/2016 1059   GFRNONAA 72 09/06/2016 1059   GFRAA 84 09/06/2016 1059    BNP No results found for: BNP  ProBNP No results found for: PROBNP  Imaging: No results found.   Assessment & Plan:   Asthma with COPD (HCC) - Patient reports mild increase in dyspnea symptoms over the last several month. He has no other respiratory complaints.  ACT score was 24/25. His lungs were clear on exam and VSS. EKG today showed normal sinus rhythm. Shortness of breath does not appear to be related to asthma exacerbation, likely d/t deconditioning. Recommend repeating PFTs. If Spirometry is normal would consider further cardiac work up. Continue Advair 250-90mcg one puff BID and prn Ventolin. He has follow-up already scheduled with Dr. 45m end of August.    September, NP 03/31/2021

## 2021-03-31 ENCOUNTER — Ambulatory Visit (INDEPENDENT_AMBULATORY_CARE_PROVIDER_SITE_OTHER): Payer: Medicare Other | Admitting: Primary Care

## 2021-03-31 ENCOUNTER — Other Ambulatory Visit: Payer: Self-pay

## 2021-03-31 ENCOUNTER — Encounter: Payer: Self-pay | Admitting: Primary Care

## 2021-03-31 VITALS — BP 132/82 | HR 82 | Temp 97.2°F | Ht 70.0 in | Wt 212.2 lb

## 2021-03-31 DIAGNOSIS — R0602 Shortness of breath: Secondary | ICD-10-CM

## 2021-03-31 DIAGNOSIS — J449 Chronic obstructive pulmonary disease, unspecified: Secondary | ICD-10-CM | POA: Diagnosis not present

## 2021-03-31 MED ORDER — ALBUTEROL SULFATE HFA 108 (90 BASE) MCG/ACT IN AERS: 1.0000 | INHALATION_SPRAY | Freq: Four times a day (QID) | RESPIRATORY_TRACT | 0 refills | Status: DC | PRN

## 2021-03-31 NOTE — Patient Instructions (Addendum)
Recommendations: - Continue Advair 1 puff morning and evening - Use Ventolin rescue inhaler 2 puffs every 6 hours as needed only for shortness of breath/wheezing  - Get back into your walking routine  Orders: - PFTs first available (ordered) - EKG (done)  Follow-up: - Keep apt with Dr. Craige Cotta 8/30/ please schedule PFTs prior or day of

## 2021-03-31 NOTE — Progress Notes (Signed)
Reviewed and agree with assessment/plan.   Coralyn Helling, MD [facility] 03/31/2021, 10:01 AM Pager:  [phone]

## 2021-03-31 NOTE — Assessment & Plan Note (Addendum)
-   Patient reports mild increase in dyspnea symptoms over the last several month. He has no other respiratory complaints.  ACT score was 24/25. His lungs were clear on exam and VSS. EKG today showed normal sinus rhythm. Shortness of breath does not appear to be related to asthma exacerbation, likely d/t deconditioning. Recommend repeating PFTs. If Spirometry is normal would consider further cardiac work up. Continue Advair 250-85mcg one puff BID and prn Ventolin. He has follow-up already scheduled with Dr. Craige Cotta end of August.

## 2021-04-24 ENCOUNTER — Other Ambulatory Visit: Payer: Self-pay

## 2021-04-24 ENCOUNTER — Ambulatory Visit (INDEPENDENT_AMBULATORY_CARE_PROVIDER_SITE_OTHER): Payer: Medicare Other | Admitting: Pulmonary Disease

## 2021-04-24 DIAGNOSIS — R0602 Shortness of breath: Secondary | ICD-10-CM

## 2021-04-24 DIAGNOSIS — J449 Chronic obstructive pulmonary disease, unspecified: Secondary | ICD-10-CM

## 2021-04-24 NOTE — Patient Instructions (Signed)
Full PFT performed today. °

## 2021-04-24 NOTE — Progress Notes (Signed)
Full PFT performed today. °

## 2021-04-25 ENCOUNTER — Encounter: Payer: Self-pay | Admitting: Pulmonary Disease

## 2021-04-25 ENCOUNTER — Other Ambulatory Visit: Payer: Self-pay | Admitting: Pulmonary Disease

## 2021-04-25 ENCOUNTER — Ambulatory Visit (INDEPENDENT_AMBULATORY_CARE_PROVIDER_SITE_OTHER): Payer: Medicare Other | Admitting: Pulmonary Disease

## 2021-04-25 VITALS — BP 120/90 | HR 70 | Temp 98.7°F | Ht 70.0 in | Wt 214.8 lb

## 2021-04-25 DIAGNOSIS — J449 Chronic obstructive pulmonary disease, unspecified: Secondary | ICD-10-CM | POA: Diagnosis not present

## 2021-04-25 DIAGNOSIS — M6740 Ganglion, unspecified site: Secondary | ICD-10-CM

## 2021-04-25 MED ORDER — VENTOLIN HFA 108 (90 BASE) MCG/ACT IN AERS: 2.0000 | INHALATION_SPRAY | Freq: Four times a day (QID) | RESPIRATORY_TRACT | 3 refills | Status: AC | PRN

## 2021-04-25 MED ORDER — ADVAIR DISKUS 250-50 MCG/ACT IN AEPB: 1.0000 | INHALATION_SPRAY | Freq: Two times a day (BID) | RESPIRATORY_TRACT | 3 refills | Status: AC

## 2021-04-25 NOTE — Progress Notes (Signed)
[facility]  Chief Complaint  Patient presents with   Follow-up    Follow up on pft, following SOB    Constitutional:  BP 120/90 (BP Location: Left Arm, Patient Position: Sitting, Cuff Size: Normal)   Pulse 70   Temp 98.7 F (37.1 C) (Oral)   Ht 5\' 10"  (1.778 m)   Wt 214 lb 12.8 oz (97.4 kg)   SpO2 98%   BMI 30.82 kg/m   Past Medical History:  Gout, Hypertension.  Past Surgical History:  He  has a past surgical history that includes No past surgeries.  Brief Summary:  Connor Palmer is a 70 y.o. male former smoker with COPD and asthma.      Subjective:   He had PFT.  Showed moderate obstruction, air trapping, and bronchodilator response.  Breathing about the same.  He isn't having cough or wheeze.  Sleeps okay.  Keeps up with activity okay.  Not using ventolin much.  He wasn't able to tolerate other forms of albuterol.  Physical Exam:   Appearance - well kempt   ENMT - no sinus tenderness, no oral exudate, no LAN, Mallampati 3 airway, no stridor  Respiratory - equal breath sounds bilaterally, no wheezing or rales  CV - s1s2 regular rate and rhythm, no murmurs  Ext - cyst lesion on left wrist, no clubbing, no edema  Skin - no rashes  Psych - normal mood and affect   Pulmonary testing:  Spirometry 03/05/14 >> FEV1 1.06 (34%), FEV1% 43 PFT 04/08/14 >> Pre FEV1 0.99 (34%)/FEV1% 42, Post FEV1 1.28 (45%)/FEV1% 49, TLC 5.65 (85%), DLCO 110% PFT 04/24/21 >> FEV1 1.28 (47%), FEV1% 50, TLC 6.83 (103%), RV 4.02 (171%) DLCO 116%, +BD  Social History:  He  reports that he quit smoking about 33 years ago. His smoking use included cigarettes. He has a 0.75 pack-year smoking history. He has never used smokeless tobacco. He reports that he does not use drugs.  Family History:  His family history includes Allergies in his father; Aneurysm in his sister; Diabetes Mellitus II in his father; HIV/AIDS in his brother; Rheum arthritis  in his mother.     Assessment/Plan:   COPD with asthma. - reviewed his PFT - continue advair - continue prn ventolin; he is intolerant of other formulations of albuterol due to palpitations and chest pain - if his breathing symptoms progress, then consider adding LAMA to his inhaler regimen but her would also need chest xray and possibly cardiac assessment  Ganglion cyst left wrist. - will arrange for referral to hand surgeon  Time Spent Involved in Patient Care on Day of Examination:  24 minutes  Follow up:   Patient Instructions  Will arrange for referral to hand surgeon  Follow up in 6 months  Medication List:   Allergies as of 04/25/2021       Reactions   Other Swelling   Pistachio, Cashews.....   Rash    Dust Mite Extract Other (See Comments)   Sinus issues   Erythromycin    Rash, bumps   Penicillins    unknown   Proair Hfa [albuterol]         Medication List        Accurate as of April 25, 2021 11:20 AM. If you have any questions, ask your nurse or doctor.          STOP taking these medications    albuterol 108 (90 Base) MCG/ACT inhaler Commonly known as: VENTOLIN  HFA Replaced by: Ventolin HFA 108 (90 Base) MCG/ACT inhaler You also have another medication with the same name that you need to continue taking as instructed. Stopped by: Coralyn Helling, MD       TAKE these medications    Advair Diskus 250-50 MCG/ACT Aepb Generic drug: fluticasone-salmeterol Inhale 1 puff into the lungs in the morning and at bedtime. What changed: See the new instructions. Changed by: Coralyn Helling, MD   albuterol (2.5 MG/3ML) 0.083% nebulizer solution Commonly known as: PROVENTIL Take 3 mLs (2.5 mg total) by nebulization every 6 (six) hours as needed for wheezing or shortness of breath. What changed:  Another medication with the same name was added. Make sure you understand how and when to take each. Another medication with the same name was removed. Continue  taking this medication, and follow the directions you see here. Changed by: Coralyn Helling, MD   Ventolin HFA 108 (90 Base) MCG/ACT inhaler Generic drug: albuterol Inhale 2 puffs into the lungs every 6 (six) hours as needed for wheezing or shortness of breath. What changed: You were already taking a medication with the same name, and this prescription was added. Make sure you understand how and when to take each. Replaces: albuterol 108 (90 Base) MCG/ACT inhaler Changed by: Coralyn Helling, MD   amLODipine-benazepril 10-40 MG capsule Commonly known as: LOTREL Take by mouth.   cholecalciferol 1000 units tablet Commonly known as: VITAMIN D Take 2,000 Units by mouth daily.   colchicine 0.6 MG tablet Commonly known as: Colcrys Take 1 tablet (0.6 mg total) by mouth daily. May take 1 tablet by mouth twice a day when necessary flares   nystatin 100000 UNIT/ML suspension Commonly known as: MYCOSTATIN Take 5 mLs (500,000 Units total) by mouth 4 (four) times daily.   VITAMIN A PO Take 1 tablet by mouth daily.        Signature:  Coralyn Helling, MD [facility] Pager - [phone] 04/25/2021, 11:20 AM

## 2021-04-25 NOTE — Telephone Encounter (Signed)
Rx for Xopenex is not covered by patients insurance, what would you like to do?

## 2021-04-25 NOTE — Patient Instructions (Signed)
Will arrange for referral to hand surgeon  Follow up in 6 months

## 2021-04-26 NOTE — Telephone Encounter (Signed)
Not clear to me where script for xopenex came from.  I sent script for ventolin HFA when he was seen on 04/25/21.

## 2021-05-03 LAB — PULMONARY FUNCTION TEST
DL/VA % pred: 120 %
DL/VA: 4.93 ml/min/mmHg/L
DLCO cor % pred: 116 %
DLCO cor: 28.27 ml/min/mmHg
DLCO unc % pred: 116 %
DLCO unc: 28.35 ml/min/mmHg
FEF 25-75 Post: 0.78 L/sec
FEF 25-75 Pre: 0.39 L/sec
FEF2575-%Change-Post: 99 %
FEF2575-%Pred-Post: 33 %
FEF2575-%Pred-Pre: 16 %
FEV1-%Change-Post: 27 %
FEV1-%Pred-Post: 47 %
FEV1-%Pred-Pre: 37 %
FEV1-Post: 1.28 L
FEV1-Pre: 1 L
FEV1FVC-%Change-Post: 9 %
FEV1FVC-%Pred-Pre: 60 %
FEV6-%Change-Post: 24 %
FEV6-%Pred-Post: 75 %
FEV6-%Pred-Pre: 60 %
FEV6-Post: 2.53 L
FEV6-Pre: 2.03 L
FEV6FVC-%Change-Post: 7 %
FEV6FVC-%Pred-Post: 105 %
FEV6FVC-%Pred-Pre: 98 %
FVC-%Change-Post: 16 %
FVC-%Pred-Post: 71 %
FVC-%Pred-Pre: 61 %
FVC-Post: 2.53 L
FVC-Pre: 2.18 L
Post FEV1/FVC ratio: 50 %
Post FEV6/FVC ratio: 100 %
Pre FEV1/FVC ratio: 46 %
Pre FEV6/FVC Ratio: 93 %
RV % pred: 171 %
RV: 4.02 L
TLC % pred: 103 %
TLC: 6.83 L

## 2021-05-08 ENCOUNTER — Other Ambulatory Visit: Payer: Self-pay

## 2021-05-08 ENCOUNTER — Ambulatory Visit (INDEPENDENT_AMBULATORY_CARE_PROVIDER_SITE_OTHER): Payer: Medicare Other

## 2021-05-08 ENCOUNTER — Ambulatory Visit (INDEPENDENT_AMBULATORY_CARE_PROVIDER_SITE_OTHER): Payer: Medicare Other | Admitting: Orthopedic Surgery

## 2021-05-08 ENCOUNTER — Encounter: Payer: Self-pay | Admitting: Orthopedic Surgery

## 2021-05-08 VITALS — BP 170/93 | HR 68 | Ht 70.0 in | Wt 215.0 lb

## 2021-05-08 DIAGNOSIS — M6740 Ganglion, unspecified site: Secondary | ICD-10-CM

## 2021-05-08 DIAGNOSIS — M67432 Ganglion, left wrist: Secondary | ICD-10-CM | POA: Diagnosis not present

## 2021-05-08 NOTE — Progress Notes (Signed)
Office Visit Note   Patient: Connor Palmer           Date of Birth: 15-Aug-1951           MRN: [id number] Visit Date: 05/08/2021              Requested by: Coralyn Helling, MD [address] [place],  [place] [address] PCP: Verlon Au, MD   Assessment & Plan: Visit Diagnoses:  1. Ganglion cyst     Plan: We discussed the diagnosis, prognosis, and both conservative and operative treatment options for volar ganglion cysts.  After our discussion, the patient has elected to proceed with surgical excision.  We reviewed the benefits of surgery and the potential risks including, but not limited to, persistent symptoms, infection, damage to nearby nerves and blood vessels, delayed wound healing.    All patient concerns and questions were addressed.  A surgical date will be confirmed with the patient.    Follow-Up Instructions: No follow-ups on file.   Orders:  Orders Placed This Encounter  Procedures   XR Wrist Complete Left   No orders of the defined types were placed in this encounter.     Procedures: No procedures performed   Clinical Data: No additional findings.   Subjective: Chief Complaint  Patient presents with   Right Wrist - Follow-up    Cyst on Left wrist, had one removed x 20 yrs ago, noticed it x 2 months ago, RIGHT hand Dom    This is a 70 year old right-hand-dominant male who presents with a mass on the volar aspect of his left wrist.  The mass has been present for about 2 months or so.  He thinks is getting bigger.  He denies any injury to this area.  Of note he had a volar ganglion cyst on the right wrist that was removed about 20 years ago.  The cyst is nonpainful.  It is bothersome from a cosmetic perspective.  He denies any numbness or paresthesias in his hand  He is interested in having the cyst removed.   Review of Systems  Constitutional: Negative.   Respiratory: Negative.    Cardiovascular: Negative.   Musculoskeletal:         Left wrist mass  Skin: Negative.   Neurological: Negative.     Objective: Vital Signs: BP (!) 170/93 (BP Location: Left Arm, Patient Position: Sitting)   Pulse 68   Ht 5\' 10"  (1.778 m)   Wt 215 lb (97.5 kg)   BMI 30.85 kg/m   Physical Exam Constitutional:      Appearance: Normal appearance.  Cardiovascular:     Rate and Rhythm: Normal rate.     Pulses: Normal pulses.  Pulmonary:     Effort: Pulmonary effort is normal.  Skin:    General: Skin is warm and dry.     Capillary Refill: Capillary refill takes 2 to 3 seconds.  Neurological:     General: No focal deficit present.     Mental Status: He is alert.    Left Hand Exam   Tenderness  The patient is experiencing no tenderness.   Muscle Strength  The patient has normal left wrist strength.  Other  Erythema: absent Scars: absent Sensation: normal Pulse: present  Comments:  Approx 2x2 cm mass at volar radial aspect of wrist.  Mass firm, well circumscribed, no adherent to underlying or adjacent tissues.      Specialty Comments:  No specialty comments available.  Imaging: 3 views  of the left wrist taken today reviewed interpreted by me.  They demonstrate degenerative changes at the radiocarpal joint with joint space narrowing and some subchondral sclerosis.  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  He has some mild degenerative changes at the [facility] and STT joints.   PMFS History: Patient Active Problem List   Diagnosis Date Noted   History of asthma 10/18/2016   History of hypertension 10/18/2016   Medication monitoring encounter 10/18/2016   Pain in both feet 09/06/2016   Chronic midline low back pain 09/06/2016   Chronic right shoulder pain 09/06/2016   Difficulty hearing 03/05/2014   Essential (primary) hypertension 10/21/2013   Asthma with COPD (HCC) 07/20/2013   Exercise-induced asthma 07/20/2013   Gout 05/25/2013   Airway hyperreactivity 01/31/2013   Arthralgia of temporomandibular joint  01/13/2013   Past Medical History:  Diagnosis Date   Asthma    Gout    Hypertension     Family History  Problem Relation Age of Onset   Rheum arthritis Mother    Allergies Father    Diabetes Mellitus II Father    HIV/AIDS Brother    Aneurysm Sister        Cerebral    Past Surgical History:  Procedure Laterality Date   NO PAST SURGERIES     Social History   Occupational History   Not on file  Tobacco Use   Smoking status: Former    Packs/day: 0.25    Years: 3.00    Pack years: 0.75    Types: Cigarettes    Quit date: 08/29/1987    Years since quitting: 33.7   Smokeless tobacco: Never  Vaping Use   Vaping Use: Never used  Substance and Sexual Activity   Alcohol use: Not on file   Drug use: No   Sexual activity: Not on file

## 2021-05-09 ENCOUNTER — Other Ambulatory Visit: Payer: Self-pay | Admitting: Pulmonary Disease

## 2021-05-12 ENCOUNTER — Telehealth: Payer: Self-pay | Admitting: Orthopedic Surgery

## 2021-05-12 NOTE — Telephone Encounter (Signed)
Pt called and stated he has decided not to do surgery. Pt states he wanted Elnita Maxwell to know not to schedule him for surgery. Pt phone number is [phone].

## 2021-05-16 ENCOUNTER — Other Ambulatory Visit: Payer: Self-pay

## 2021-05-16 MED ORDER — ALBUTEROL SULFATE HFA 108 (90 BASE) MCG/ACT IN AERS
2.0000 | INHALATION_SPRAY | Freq: Four times a day (QID) | RESPIRATORY_TRACT | 11 refills | Status: DC | PRN
Start: 2021-05-16 — End: 2021-05-30

## 2021-05-18 ENCOUNTER — Ambulatory Visit (HOSPITAL_BASED_OUTPATIENT_CLINIC_OR_DEPARTMENT_OTHER): Admission: RE | Admit: 2021-05-18 | Payer: Medicare Other | Source: Home / Self Care | Admitting: Orthopedic Surgery

## 2021-05-18 ENCOUNTER — Encounter (HOSPITAL_BASED_OUTPATIENT_CLINIC_OR_DEPARTMENT_OTHER): Admission: RE | Payer: Self-pay | Source: Home / Self Care

## 2021-05-18 SURGERY — EXCISION, GANGLION CYST, WRIST
Anesthesia: Choice | Site: Wrist | Laterality: Left

## 2021-05-29 ENCOUNTER — Telehealth: Payer: Self-pay | Admitting: Pulmonary Disease

## 2021-05-29 NOTE — Telephone Encounter (Signed)
Patient came into office and states there is a back and forth with his ventolin prescription with insurance, pharmacy and the office. Patient only has a few days left of his medication and needs something done.  Please advise.

## 2021-05-30 MED ORDER — ALBUTEROL SULFATE HFA 108 (90 BASE) MCG/ACT IN AERS: 2.0000 | INHALATION_SPRAY | Freq: Four times a day (QID) | RESPIRATORY_TRACT | 11 refills | Status: AC | PRN

## 2021-05-30 NOTE — Telephone Encounter (Signed)
Called and spoke with the pharmacy and they stated that his insurance does not cover the ventolin hfa.  They will cover the generic proair hfa.  This has been sent to the pharmacy.  Nothing further is needed.

## 2021-05-30 NOTE — Telephone Encounter (Signed)
Lm for patient.  

## 2021-06-01 ENCOUNTER — Telehealth: Payer: Self-pay | Admitting: Pulmonary Disease

## 2021-06-01 NOTE — Telephone Encounter (Signed)
Call made to patient, confirmed DOB. Patient wanted to know if a PA had been submitted for his ventolin. I made him aware it has and we are just awaiting determination.   Will route back to triage and f/u tomorrow.

## 2021-06-01 NOTE — Telephone Encounter (Signed)
Called pt's pharmacy about the ventolin inhaler. Pharmacy stated that pt is wanting ventolin brand name inhaler due to the proair which is preferred causing him to have headaches. The brand name ventolin is not covered by insurance and without a PA, inhaler will cost pt $70. When went to [facility], it said that the Ventolin HFA was not requiring a PA but the pharmacy stated that it did so went ahead to see if it would let me do one.  PA has been initiated: PA request was received from (pharmacy): [facility] [address] [phone] Fax: [phone] Medication name and strength: Ventolin HFA Ordering Provider: Craige Cotta  Was PA started with [facility]?: yes If yes, please enter KEY: [id number]  Medication tried and failed: albuterol hfa, proair hfa Covered Alternatives: proair hfa (pt cannot tolerate it)  PA sent to plan, time frame for approval / denial: Your information has been submitted to [employer]. [employer] will review the request and will issue a decision, typically within 1-3 days from your submission. You can check the updated outcome later by reopening this request.  If [employer] has not responded in 1-3 days or if you have any questions about your ePA request, please contact [employer] at [phone].  Leaving in triage for [place] to follow up on.

## 2021-06-02 NOTE — Telephone Encounter (Signed)
Call made to [employer], confirmed patient DOB/info. Inquiring about PA for ventolin. Ventolin was approved 06/02/21 until 06/01/2022.   Call made to patient, confirmed DOB. Made aware of approval. Voiced understanding.   Call made to pharmacy to make aware medication has been approved.   Nothing further needed at this time.

## 2021-06-02 NOTE — Telephone Encounter (Signed)
Duplicate message. PA has been approved.   Nothing further needed at this time.

## 2021-10-06 ENCOUNTER — Encounter: Payer: Self-pay | Admitting: Pulmonary Disease

## 2021-10-06 ENCOUNTER — Ambulatory Visit (INDEPENDENT_AMBULATORY_CARE_PROVIDER_SITE_OTHER): Payer: Medicare Other | Admitting: Pulmonary Disease

## 2021-10-06 ENCOUNTER — Other Ambulatory Visit: Payer: Self-pay

## 2021-10-06 VITALS — BP 128/70 | HR 79 | Temp 98.2°F | Ht 70.0 in | Wt 212.8 lb

## 2021-10-06 DIAGNOSIS — J449 Chronic obstructive pulmonary disease, unspecified: Secondary | ICD-10-CM

## 2021-10-06 NOTE — Progress Notes (Signed)
[facility]  Chief Complaint  Patient presents with   Follow-up    No new updates, feeling better    Constitutional:  BP 128/70 (BP Location: Left Arm, Cuff Size: Large)    Pulse 79    Temp 98.2 F (36.8 C) (Oral)    Ht 5\' 10"  (1.778 m)    Wt 212 lb 12.8 oz (96.5 kg)    SpO2 98%    BMI 30.53 kg/m   Past Medical History:  Gout, Hypertension.  Past Surgical History:  He  has a past surgical history that includes No past surgeries.  Brief Summary:  Connor Palmer is a 71 y.o. male former smoker with COPD and asthma.      Subjective:   He has been doing okay.  Not having cough, wheeze, or sputum.  Hasn't needed albuterol recently.  Sleeping okay.  Keeps up with activities without difficulty.  Not having any sinus issues.  Every few months he gets a spasm in his chest that lasts for a few minutes and goes away.  Has been occurring off and on for the past 5 or 6 years.  Physical Exam:   Appearance - well kempt   ENMT - no sinus tenderness, no oral exudate, no LAN, Mallampati 3 airway, no stridor  Respiratory - equal breath sounds bilaterally, no wheezing or rales  CV - s1s2 regular rate and rhythm, no murmurs  Ext - no clubbing, no edema  Skin - no rashes  Psych - normal mood and affect    Pulmonary testing:  Spirometry 03/05/14 >> FEV1 1.06 (34%), FEV1% 43 PFT 04/08/14 >> Pre FEV1 0.99 (34%)/FEV1% 42, Post FEV1 1.28 (45%)/FEV1% 49, TLC 5.65 (85%), DLCO 110% PFT 04/24/21 >> FEV1 1.28 (47%), FEV1% 50, TLC 6.83 (103%), RV 4.02 (171%) DLCO 116%, +BD  Social History:  He  reports that he quit smoking about 34 years ago. His smoking use included cigarettes. He has a 0.75 pack-year smoking history. He has never used smokeless tobacco. He reports that he does not use drugs.  Family History:  His family history includes Allergies in his father; Aneurysm in his sister; Diabetes Mellitus II in his father; HIV/AIDS in his brother; Rheum  arthritis in his mother.     Assessment/Plan:   COPD with asthma. - continue advair 250 one puff bid - prn ventolin;he is intolerant of other formulations of albuterol due to palpitations and chest pain   Time Spent Involved in Patient Care on Day of Examination:  27 minutes  Follow up:   Patient Instructions  Follow up in 1 year  Medication List:   Allergies as of 10/06/2021       Reactions   Other Swelling   Pistachio, Cashews.....   Rash    Dust Mite Extract Other (See Comments)   Sinus issues   Erythromycin    Rash, bumps   Penicillins    unknown   Proair Hfa [albuterol]         Medication List        Accurate as of October 06, 2021  4:45 PM. If you have any questions, ask your nurse or doctor.          Advair Diskus 250-50 MCG/ACT Aepb Generic drug: fluticasone-salmeterol Inhale 1 puff into the lungs in the morning and at bedtime.   albuterol (2.5 MG/3ML) 0.083% nebulizer solution Commonly known as: PROVENTIL Take 3 mLs (2.5 mg total) by nebulization every 6 (six) hours as needed  for wheezing or shortness of breath.   Ventolin HFA 108 (90 Base) MCG/ACT inhaler Generic drug: albuterol Inhale 2 puffs into the lungs every 6 (six) hours as needed for wheezing or shortness of breath.   albuterol 108 (90 Base) MCG/ACT inhaler Commonly known as: ProAir HFA Inhale 2 puffs into the lungs every 6 (six) hours as needed for wheezing or shortness of breath.   amLODipine-benazepril 10-40 MG capsule Commonly known as: LOTREL Take by mouth.   cholecalciferol 1000 units tablet Commonly known as: VITAMIN D Take 2,000 Units by mouth daily.   colchicine 0.6 MG tablet Commonly known as: Colcrys Take 1 tablet (0.6 mg total) by mouth daily. May take 1 tablet by mouth twice a day when necessary flares   nystatin 100000 UNIT/ML suspension Commonly known as: MYCOSTATIN Take 5 mLs (500,000 Units total) by mouth 4 (four) times daily.   VITAMIN A PO Take 1  tablet by mouth daily.        Signature:  Coralyn Helling, MD [facility] Pager - [phone] 10/06/2021, 4:45 PM

## 2021-10-06 NOTE — Patient Instructions (Signed)
Follow up in 1 year.

## 2023-01-20 ENCOUNTER — Other Ambulatory Visit: Payer: Self-pay | Admitting: Pulmonary Disease

## 2023-12-26 ENCOUNTER — Ambulatory Visit: Admitting: Primary Care

## 2023-12-26 ENCOUNTER — Encounter: Payer: Self-pay | Admitting: Primary Care

## 2023-12-26 VITALS — BP 132/64 | HR 59 | Temp 97.6°F | Ht 69.5 in | Wt 199.4 lb

## 2023-12-26 DIAGNOSIS — Z87891 Personal history of nicotine dependence: Secondary | ICD-10-CM

## 2023-12-26 DIAGNOSIS — I10 Essential (primary) hypertension: Secondary | ICD-10-CM | POA: Diagnosis not present

## 2023-12-26 DIAGNOSIS — J4489 Other specified chronic obstructive pulmonary disease: Secondary | ICD-10-CM | POA: Diagnosis not present

## 2023-12-26 NOTE — Patient Instructions (Addendum)
  VISIT SUMMARY: You came in today for a follow-up visit regarding your COPD with asthma and hypertension. Your lung function has been stable, and your blood pressure is well-controlled.  YOUR PLAN: -COPD WITH ASTHMA: COPD with asthma is a type of chronic lung disease that includes both chronic obstructive pulmonary disease and asthma, which causes difficulty breathing. Your lung function has been stable since 2015. You are currently taking Advair  once daily and using your rescue inhaler about three times a week. We have refilled your Advair  prescription and recommend that you take it twice daily if your asthma symptoms flare up. Please follow up with a pulmonologist and return if you experience increased respiratory symptoms such as bronchitis, frequent use of your rescue inhaler, shortness of breath, or chest tightness.  -HYPERTENSION: Hypertension, or high blood pressure, is a condition where the force of the blood against your artery walls is too high. Your blood pressure today is 132/64 mmHg, which indicates that it is well-controlled with your current medication, amlodipine or Lortel. We have documented your blood pressure reading in your after visit summary and form.  INSTRUCTIONS: Please follow up with a pulmonologist for your COPD with asthma. Return to the clinic if you experience increased respiratory symptoms such as bronchitis, frequent use of your rescue inhaler, shortness of breath, or chest tightness.  Follow-up 1 year follow-up with Dr. Dione Franks (New patient- 30 mins)

## 2023-12-26 NOTE — Progress Notes (Signed)
 @Patient  ID: Connor Palmer, male    DOB: October 20, 1950, 73 y.o.   MRN: [id number]  Chief Complaint  Patient presents with   Follow-up    Doing well, denies sob/cough.Has CDL paperwork.    Referring provider: Jacqulyne Maxim, MD  HPI: 73 year old male, former smoker. PMH significant for COPD/asthma and HTN. Former patient of Dr. Matilde Son,  Maintained on Advair  diskus (he did not tolerate [place] d/t throat irritation). Recommend repeat spirometry testing if symptoms progress.   12/26/2023 Discussed the use of AI scribe software for clinical note transcription with the patient, who gave verbal consent to proceed.  History of Present Illness   Connor Palmer is a 73 year old male with COPD and asthma who presents for a follow-up visit.  He has a history of COPD with an asthmatic component, characterized as moderate to severe obstructive lung disease with reversibility. Pulmonary function tests in 2015 and 2022 showed stable lung function. He is currently on Advair , taken once daily instead of the prescribed twice daily, and uses his rescue inhaler approximately three times a week. No breakthrough symptoms such as shortness of breath, chest tightness, wheezing, or cough.  He is not currently being treated for sleep apnea and reports no issues with sleep, such as falling asleep during the day or experiencing sudden sleep attacks. He sleeps about five to six hours per night without waking up gasping or short of breath.  He is on blood pressure medication, specifically amlodipine (Lortel), and reports consistent use with controlled blood pressure.      Spirometry 03/05/14 >> FEV1 1.06 (34%), FEV1% 43 PFT 04/08/14 >> Pre FEV1 0.99 (34%)/FEV1% 42, Post FEV1 1.28 (45%)/FEV1% 49, TLC 5.65 (85%), DLCO 110% PFT 04/24/21 >> FEV1 1.28 (47%), FEV1% 50, TLC 6.83 (103%), RV 4.02 (171%) DLCO 116%, +BD   Allergies  Allergen Reactions   Other Swelling    Pistachio, Cashews.....   Rash    Dust Mite  Extract Other (See Comments)    Sinus issues   Erythromycin     Rash, bumps   Penicillins     unknown   Proair  Hfa [Albuterol ]     Immunization History  Administered Date(s) Administered   Influenza,inj,Quad PF,6+ Mos 08/31/2013   Influenza,inj,quad, With Preservative 08/31/2013   PFIZER(Purple Top)SARS-COV-2 Vaccination 12/01/2019, 12/23/2019   Pneumococcal Polysaccharide-23 05/08/2012   Typhoid Live 01/07/2015   Yellow Fever 01/07/2015    Past Medical History:  Diagnosis Date   Asthma    Gout    Hypertension     Tobacco History: Social History   Tobacco Use  Smoking Status Former   Current packs/day: 0.00   Average packs/day: 0.3 packs/day for 3.0 years (0.8 ttl pk-yrs)   Types: Cigarettes   Start date: 08/28/1984   Quit date: 08/29/1987   Years since quitting: 36.3  Smokeless Tobacco Never   Counseling given: Not Answered   Outpatient Medications Prior to Visit  Medication Sig Dispense Refill   ADVAIR  DISKUS 250-50 MCG/ACT AEPB Inhale 1 puff into the lungs in the morning and at bedtime. (Patient taking differently: Inhale 1 puff into the lungs in the morning and at bedtime. Using one time a day) 180 each 3   amLODipine-benazepril (LOTREL) 10-40 MG capsule Take 1 capsule by mouth daily.     cholecalciferol (VITAMIN D) 1000 units tablet Take 2,000 Units by mouth daily.     colchicine  (COLCRYS ) 0.6 MG tablet Take 1 tablet (0.6 mg total) by mouth daily. May take 1 tablet by  mouth twice a day when necessary flares 100 tablet 1   nystatin  (MYCOSTATIN ) 100000 UNIT/ML suspension Take 5 mLs (500,000 Units total) by mouth 4 (four) times daily. 60 mL 0   VITAMIN A PO Take 1 tablet by mouth daily.     albuterol  (PROAIR  HFA) 108 (90 Base) MCG/ACT inhaler Inhale 2 puffs into the lungs every 6 (six) hours as needed for wheezing or shortness of breath. (Patient not taking: Reported on 12/26/2023) 18 g 11   albuterol  (PROVENTIL ) (2.5 MG/3ML) 0.083% nebulizer solution Take 3 mLs (2.5 mg  total) by nebulization every 6 (six) hours as needed for wheezing or shortness of breath. (Patient not taking: Reported on 12/26/2023) 120 vial 5   amLODipine-benazepril (LOTREL) 10-40 MG capsule Take by mouth.     VENTOLIN  HFA 108 (90 Base) MCG/ACT inhaler Inhale 2 puffs into the lungs every 6 (six) hours as needed for wheezing or shortness of breath. (Patient not taking: Reported on 12/26/2023) 3 each 3   No facility-administered medications prior to visit.   Review of Systems  Review of Systems  Constitutional: Negative.   HENT: Negative.  Negative for congestion.   Respiratory: Negative.  Negative for cough, shortness of breath and wheezing.   Cardiovascular: Negative.    Physical Exam  BP 132/64 (BP Location: Left Arm, Patient Position: Sitting, Cuff Size: Large)   Pulse (!) 59   Temp 97.6 F (36.4 C) (Oral)   Ht 5' 9.5" (1.765 m)   Wt 199 lb 6.4 oz (90.4 kg)   SpO2 97%   BMI 29.02 kg/m  Physical Exam Constitutional:      General: He is not in acute distress.    Appearance: Normal appearance. He is not ill-appearing.  HENT:     Head: Normocephalic and atraumatic.  Cardiovascular:     Rate and Rhythm: Normal rate and regular rhythm.  Pulmonary:     Effort: Pulmonary effort is normal.     Breath sounds: Normal breath sounds. No wheezing or rales.  Neurological:     General: No focal deficit present.     Mental Status: He is alert and oriented to person, place, and time. Mental status is at baseline.  Psychiatric:        Mood and Affect: Mood normal.        Behavior: Behavior normal.        Thought Content: Thought content normal.        Judgment: Judgment normal.      Lab Results:  CBC    Component Value Date/Time   WBC 4.6 09/06/2016 1059   RBC 5.32 09/06/2016 1059   HGB 14.7 09/06/2016 1059   HCT 45.0 09/06/2016 1059   PLT 246 09/06/2016 1059   MCV 84.6 09/06/2016 1059   MCH 27.6 09/06/2016 1059   MCHC 32.7 09/06/2016 1059   RDW 15.0 09/06/2016 1059    LYMPHSABS 2,300 09/06/2016 1059   MONOABS 368 09/06/2016 1059   EOSABS 92 09/06/2016 1059   BASOSABS 0 09/06/2016 1059    BMET    Component Value Date/Time   NA 136 09/06/2016 1059   K 4.2 09/06/2016 1059   CL 101 09/06/2016 1059   CO2 27 09/06/2016 1059   GLUCOSE 83 09/06/2016 1059   BUN 11 09/06/2016 1059   CREATININE 1.07 09/06/2016 1059   CALCIUM 9.3 09/06/2016 1059   GFRNONAA 72 09/06/2016 1059   GFRAA 84 09/06/2016 1059    BNP No results found for: "BNP"  ProBNP No results found  for: "PROBNP"  Imaging: No results found.   Assessment & Plan:    1. Asthma with COPD (HCC) (Primary)  2. Essential (primary) hypertension  Assessment and Plan    COPD with asthma Moderate to severe obstructive lung disease with an asthmatic component. Lung function has been stable since 2015. Currently on Advair  once daily, with no recent respiratory infections, hospitalizations, or significant breakthrough symptoms. Uses rescue inhaler approximately three times a week. - Advise to take Advair  twice daily, refill provided  - Follow-up in 1 year with a new pulmonologist. - Instruct to return if experiencing increased respiratory symptoms, such as bronchitis, frequent use of rescue inhaler, dyspnea, or chest tightness. 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 Currently taking amlodipine (Lortel) consistently. - Document blood pressure reading on after visit summary and form.   Antonio Baumgarten, NP 12/26/2023

## 2024-07-10 ENCOUNTER — Ambulatory Visit: Payer: Self-pay | Admitting: Nurse Practitioner

## 2024-07-10 ENCOUNTER — Ambulatory Visit (INDEPENDENT_AMBULATORY_CARE_PROVIDER_SITE_OTHER): Admitting: Nurse Practitioner

## 2024-07-10 VITALS — BP 144/92 | HR 64 | Temp 98.0°F | Ht 69.5 in | Wt 200.2 lb

## 2024-07-10 DIAGNOSIS — E876 Hypokalemia: Secondary | ICD-10-CM

## 2024-07-10 DIAGNOSIS — J4489 Other specified chronic obstructive pulmonary disease: Secondary | ICD-10-CM | POA: Diagnosis not present

## 2024-07-10 DIAGNOSIS — I10 Essential (primary) hypertension: Secondary | ICD-10-CM

## 2024-07-10 DIAGNOSIS — R413 Other amnesia: Secondary | ICD-10-CM

## 2024-07-10 DIAGNOSIS — E663 Overweight: Secondary | ICD-10-CM | POA: Diagnosis not present

## 2024-07-10 DIAGNOSIS — G4700 Insomnia, unspecified: Secondary | ICD-10-CM

## 2024-07-10 LAB — VITAMIN B12: Vitamin B-12: 452 pg/mL (ref 211–911)

## 2024-07-10 LAB — LIPID PANEL
Cholesterol: 194 mg/dL (ref 0–200)
HDL: 70.5 mg/dL (ref >39.00)
LDL Cholesterol: 107 mg/dL — ABNORMAL HIGH (ref 0–99)
NonHDL: 123.99
Total CHOL/HDL Ratio: 3
Triglycerides: 85 mg/dL (ref 0.0–149.0)
VLDL: 17 mg/dL (ref 0.0–40.0)

## 2024-07-10 LAB — COMPREHENSIVE METABOLIC PANEL WITH GFR
ALT: 17 U/L (ref 0–53)
AST: 21 U/L (ref 0–37)
Albumin: 4.3 g/dL (ref 3.5–5.2)
Alkaline Phosphatase: 80 U/L (ref 39–117)
BUN: 10 mg/dL (ref 6–23)
CO2: 31 meq/L (ref 19–32)
Calcium: 9.1 mg/dL (ref 8.4–10.5)
Chloride: 100 meq/L (ref 96–112)
Creatinine, Ser: 0.94 mg/dL (ref 0.40–1.50)
GFR: 80.53 mL/min (ref >60.00)
Glucose, Bld: 84 mg/dL (ref 70–99)
Potassium: 3.3 meq/L — ABNORMAL LOW (ref 3.5–5.1)
Sodium: 140 meq/L (ref 135–145)
Total Bilirubin: 0.8 mg/dL (ref 0.2–1.2)
Total Protein: 7.1 g/dL (ref 6.0–8.3)

## 2024-07-10 LAB — CBC
HCT: 44.9 % (ref 39.0–52.0)
Hemoglobin: 14.9 g/dL (ref 13.0–17.0)
MCHC: 33.2 g/dL (ref 30.0–36.0)
MCV: 83.1 fl (ref 78.0–100.0)
Platelets: 224 K/uL (ref 150.0–400.0)
RBC: 5.4 Mil/uL (ref 4.22–5.81)
RDW: 14.8 % (ref 11.5–15.5)
WBC: 4 K/uL (ref 4.0–10.5)

## 2024-07-10 LAB — TSH: TSH: 0.65 u[IU]/mL (ref 0.35–5.50)

## 2024-07-10 NOTE — Patient Instructions (Signed)
 Please check your blood pressure at home and keep a log of this.  Bring this with you to your next appointment.   Sleep hygiene practices: Set schedule. Go to sleep and wake up at similar times each day. Have a evening routine to help you relax and get ready to sleep. Avoid caffeine after noon Avoid daytime naps Avoid excessive exercise late in to the evening If you can't fall asleep within 30 minutes, get up and enjoy a relaxing activity before trying to lay back down Consider evaluation for sleep apnea when you see the neurology team Avoid screens for 60-90 minutes before bedtime

## 2024-07-10 NOTE — Assessment & Plan Note (Signed)
  Hypertension Elevated blood pressure noted. First-time visit, no immediate medication adjustments. - Continue amlodipine-benazepril 10-40mg /day - Recommended home blood pressure monitoring twice a week. - Scheduled follow-up in one month.

## 2024-07-10 NOTE — Progress Notes (Signed)
 New Patient Office Visit  Subjective    Patient ID: Connor Palmer, male    DOB: 1951-07-03  Age: 73 y.o. MRN: [id number]  CC:  Chief Complaint  Patient presents with   Asthma    HPI Connor Palmer presents to establish care Discussed the use of AI scribe software for clinical note transcription with the patient, who gave verbal consent to proceed.  History of Present Illness Connor Palmer is a 73 year old male who presents with concerns about memory issues.  Cognitive impairment - Patient is accompanied by wife who assists with HPI. Both her and the patient are concerned about memory and cognition. - Difficulties with memory, including forgetting routine tasks, difficulty with recalling intended actions, some slowed transitions between tasks. - Symptoms are mild but cause concern for progression - Engages in activities requiring sustained attention, such as teaching  Sleep disturbance - Falls asleep easily but struggles to stay asleep - Achieves a good night's sleep two to three times per week - On good nights, sleeps four to five hours; on bad nights, approximately three hours - Goes to bed late and wakes up early, contributing to sleep deprivation - Reads during nighttime awakenings - Snores but denies witnessed apneic episodes  Asthma with COPD overlay - Requesting referral to pulmonology group (was being treated at [facility] but would like to transition to [facility])    Outpatient Encounter Medications as of 07/10/2024  Medication Sig   ADVAIR  DISKUS 250-50 MCG/ACT AEPB Inhale 1 puff into the lungs in the morning and at bedtime. (Patient taking differently: Inhale 1 puff into the lungs in the morning and at bedtime. Using one time a day)   amLODipine-benazepril (LOTREL) 10-40 MG capsule Take 1 capsule by mouth daily.   cholecalciferol (VITAMIN D) 1000 units tablet Take 2,000 Units by mouth daily.   colchicine  (COLCRYS ) 0.6 MG tablet Take 1 tablet (0.6 mg total) by mouth  daily. May take 1 tablet by mouth twice a day when necessary flares   VITAMIN A PO Take 1 tablet by mouth daily.   [DISCONTINUED] amLODipine-benazepril (LOTREL) 10-40 MG capsule Take by mouth.   albuterol  (PROAIR  HFA) 108 (90 Base) MCG/ACT inhaler Inhale 2 puffs into the lungs every 6 (six) hours as needed for wheezing or shortness of breath. (Patient not taking: Reported on 07/10/2024)   albuterol  (PROVENTIL ) (2.5 MG/3ML) 0.083% nebulizer solution Take 3 mLs (2.5 mg total) by nebulization every 6 (six) hours as needed for wheezing or shortness of breath. (Patient not taking: Reported on 07/10/2024)   VENTOLIN  HFA 108 (90 Base) MCG/ACT inhaler Inhale 2 puffs into the lungs every 6 (six) hours as needed for wheezing or shortness of breath. (Patient not taking: Reported on 07/10/2024)   [DISCONTINUED] nystatin  (MYCOSTATIN ) 100000 UNIT/ML suspension Take 5 mLs (500,000 Units total) by mouth 4 (four) times daily. (Patient not taking: Reported on 07/10/2024)   No facility-administered encounter medications on file as of 07/10/2024.    Past Medical History:  Diagnosis Date   Asthma    Gout    Hypertension     Past Surgical History:  Procedure Laterality Date   NO PAST SURGERIES      Family History  Problem Relation Age of Onset   Rheum arthritis Mother    Allergies Father    Diabetes Mellitus II Father    HIV/AIDS Brother    Aneurysm Sister        Cerebral    Social History   Socioeconomic History   Marital  status: Married    Spouse name: Not on file   Number of children: Not on file   Years of education: Not on file   Highest education level: Not on file  Occupational History   Not on file  Tobacco Use   Smoking status: Former    Current packs/day: 0.00    Average packs/day: 0.3 packs/day for 3.0 years (0.8 ttl pk-yrs)    Types: Cigarettes    Start date: 08/28/1984    Quit date: 08/29/1987    Years since quitting: 36.8   Smokeless tobacco: Never  Vaping Use   Vaping  status: Never Used  Substance and Sexual Activity   Alcohol use: Not on file   Drug use: No   Sexual activity: Not on file  Other Topics Concern   Not on file  Social History Narrative   Not on file   Social Drivers of Health   Financial Resource Strain: Not on file  Food Insecurity: Low Risk  (11/12/2023)   Received from [facility]   Hunger Vital Sign    Within the past 12 months, you worried that your food would run out before you got money to buy more: Never true    Within the past 12 months, the food you bought just didn't last and you didn't have money to get more. : Never true  Transportation Needs: No Transportation Needs (11/12/2023)   Received from [facility]    In the past 12 months, has lack of reliable transportation kept you from medical appointments, meetings, work or from getting things needed for daily living? : No  Physical Activity: Not on file  Stress: Not on file  Social Connections: Not on file  Intimate Partner Violence: Not on file    ROS: see HPI      Objective    BP (!) 144/92   Pulse 64   Temp 98 F (36.7 C) (Temporal)   Ht 5' 9.5 (1.765 m)   Wt 200 lb 4 oz (90.8 kg)   SpO2 97%   BMI 29.15 kg/m   Physical Exam Vitals reviewed.  Constitutional:      Appearance: Normal appearance.  HENT:     Head: Normocephalic and atraumatic.  Cardiovascular:     Rate and Rhythm: Normal rate and regular rhythm.  Pulmonary:     Effort: Pulmonary effort is normal.     Breath sounds: Normal breath sounds.  Musculoskeletal:     Cervical back: Neck supple.  Skin:    General: Skin is warm and dry.  Neurological:     Mental Status: He is alert and oriented to person, place, and time.  Psychiatric:        Mood and Affect: Mood normal.        Behavior: Behavior normal.        Thought Content: Thought content normal.        Judgment: Judgment normal.         Assessment & Plan:   Problem List Items Addressed This Visit        Cardiovascular and Mediastinum   Essential (primary) hypertension    Hypertension Elevated blood pressure noted. First-time visit, no immediate medication adjustments. - Continue amlodipine-benazepril 10-40mg /day - Recommended home blood pressure monitoring twice a week. - Scheduled follow-up in one month.      Relevant Orders   CBC   Comprehensive metabolic panel with GFR   Lipid panel   TSH   Vitamin B12  Respiratory   Asthma with COPD (HCC) - Primary   Asthma Well-controlled with current medication. No recent exacerbations or nocturnal symptoms. - Referred to pulmonology for further management. - Continue advair  1 puff BID - Ventolin  as needed - Offered flu and pneumonia vaccines, patient declines - Continue to abstain from smoking      Relevant Orders   Ambulatory referral to Pulmonology   CBC   Comprehensive metabolic panel with GFR   Lipid panel   TSH   Vitamin B12     Other   Overweight   Labs ordered, further recommendations may be made based upon his results       Relevant Orders   CBC   Comprehensive metabolic panel with GFR   Lipid panel   TSH   Vitamin B12   Memory loss   Memory impairment Mild impairment with task transition difficulty and forgetfulness. Hearing aid use may contribute. No severe cognitive decline. - Ordered labs . - Referred to neurology for evaluation and potential imaging. - Encouraged mental exercises, physical activity, and social interaction.      Relevant Orders   Ambulatory referral to Neurology   CBC   Comprehensive metabolic panel with GFR   Lipid panel   TSH   Vitamin B12   Insomnia   Insomnia Chronic insomnia with difficulty maintaining sleep. Sleeps 3-5 hours per night. No interest in pharmacological intervention. - Discussed sleep hygiene practices. - Discussed potential use of trazodone or melatonin if issues persist. - Referred to neurology for evaluation of potential sleep  apnea.  Tinnitus Intermittent tinnitus, possibly exacerbated by COVID vaccination. Meniere's disease unlikely. - Recommended white noise machine. - Advised against ototoxic medications such as asa      Assessment and Plan Assessment & Plan Asthma Well-controlled with current medication. No recent exacerbations or nocturnal symptoms. - Referred to pulmonology for further management. - Continue advair  1 puff BID - Ventolin  as needed - Offered flu and pneumonia vaccines, patient declines - Continue to abstain from smoking  Memory impairment Mild impairment with task transition difficulty and forgetfulness. Hearing aid use may contribute. No severe cognitive decline. - Ordered labs . - Referred to neurology for evaluation and potential imaging. - Encouraged mental exercises, physical activity, and social interaction.  Insomnia Chronic insomnia with difficulty maintaining sleep. Sleeps 3-5 hours per night. No interest in pharmacological intervention. - Discussed sleep hygiene practices. - Discussed potential use of trazodone or melatonin if issues persist. - Referred to neurology for evaluation of potential sleep apnea.  Tinnitus Intermittent tinnitus, possibly exacerbated by COVID vaccination. Meniere's disease unlikely. - Recommended white noise machine. - Advised against ototoxic medications such as asa  Hypertension Elevated blood pressure noted. First-time visit, no immediate medication adjustments. - Continue amlodipine-benazepril 10-40mg /day - Recommended home blood pressure monitoring twice a week. - Scheduled follow-up in one month.  Hearing loss Substantial hearing loss managed with hearing aids. Recent adjustment may impact memory and cognition. - Encouraged consistent use of hearing aids.    Return in about 1 month (around 08/09/2024) for F/U with Lauraine, HTN.   Lauraine FORBES Pereyra, NP

## 2024-07-10 NOTE — Assessment & Plan Note (Addendum)
 Insomnia Chronic insomnia with difficulty maintaining sleep. Sleeps 3-5 hours per night. No interest in pharmacological intervention. - Discussed sleep hygiene practices. - Discussed potential use of trazodone or melatonin if issues persist. - Referred to neurology for evaluation of potential sleep apnea.  Tinnitus Intermittent tinnitus, possibly exacerbated by COVID vaccination. Meniere's disease unlikely. - Recommended white noise machine. - Advised against ototoxic medications such as asa

## 2024-07-10 NOTE — Assessment & Plan Note (Signed)
 Labs ordered, further recommendations may be made based upon his results.

## 2024-07-10 NOTE — Assessment & Plan Note (Signed)
 Memory impairment Mild impairment with task transition difficulty and forgetfulness. Hearing aid use may contribute. No severe cognitive decline. - Ordered labs . - Referred to neurology for evaluation and potential imaging. - Encouraged mental exercises, physical activity, and social interaction.

## 2024-07-10 NOTE — Assessment & Plan Note (Signed)
 Asthma Well-controlled with current medication. No recent exacerbations or nocturnal symptoms. - Referred to pulmonology for further management. - Continue advair  1 puff BID - Ventolin  as needed - Offered flu and pneumonia vaccines, patient declines - Continue to abstain from smoking

## 2024-08-12 ENCOUNTER — Encounter

## 2024-08-13 ENCOUNTER — Ambulatory Visit: Admitting: Nurse Practitioner

## 2024-08-13 VITALS — BP 130/88 | HR 88 | Temp 98.2°F | Ht 69.5 in | Wt 195.4 lb

## 2024-08-13 DIAGNOSIS — I10 Essential (primary) hypertension: Secondary | ICD-10-CM

## 2024-08-13 DIAGNOSIS — M1A09X0 Idiopathic chronic gout, multiple sites, without tophus (tophi): Secondary | ICD-10-CM

## 2024-08-13 DIAGNOSIS — E785 Hyperlipidemia, unspecified: Secondary | ICD-10-CM

## 2024-08-13 MED ORDER — COLCHICINE 0.6 MG PO TABS: 0.6000 mg | ORAL_TABLET | Freq: Every day | ORAL | 3 refills | Status: AC | PRN

## 2024-08-13 NOTE — Progress Notes (Unsigned)
° °  Established Patient Office Visit  Subjective   Patient ID: Lemont Sitzmann, male    DOB: Apr 13, 1951  Age: 73 y.o. MRN: [id number]  No chief complaint on file.   Discussed the use of AI scribe software for clinical note transcription with the patient, who gave verbal consent to proceed.  History of Present Illness     {History (Optional):23778}  ROS    Objective:     BP 130/88   Pulse 88   Temp 98.2 F (36.8 C) (Temporal)   Ht 5' 9.5 (1.765 m)   Wt 195 lb 6 oz (88.6 kg)   SpO2 98%   BMI 28.44 kg/m  BP Readings from Last 3 Encounters:  08/13/24 130/88  07/10/24 (!) 144/92  12/26/23 132/64   Wt Readings from Last 3 Encounters:  08/13/24 195 lb 6 oz (88.6 kg)  07/10/24 200 lb 4 oz (90.8 kg)  12/26/23 199 lb 6.4 oz (90.4 kg)      Physical Exam   No results found for any visits on 08/13/24.  {Labs (Optional):23779}  The 10-year ASCVD risk score (Arnett DK, et al., 2019) is: 19.3%    Assessment & Plan:   Problem List Items Addressed This Visit   None  Assessment and Plan Assessment & Plan     No follow-ups on file.    Lauraine FORBES Pereyra, NP

## 2024-08-14 DIAGNOSIS — E785 Hyperlipidemia, unspecified: Secondary | ICD-10-CM | POA: Insufficient documentation

## 2024-08-14 NOTE — Assessment & Plan Note (Signed)
 Gout - Refill colchicine  prescription at [facility] on [address].

## 2024-08-14 NOTE — Assessment & Plan Note (Signed)
 Hyperlipidemia LDL cholesterol slightly elevated at 107 mg/dL. Cardiovascular risk >10% over 10 years. Discussed dietary changes, pharmacologic treatment, and further evaluation with calcium CT scan. He prefers dietary changes alone to start. - Provide handout on Mediterranean diet. - Advise dietary changes focusing on vegetables, whole grains, and lean proteins. - Recheck cholesterol levels in 3 months.

## 2024-08-14 NOTE — Assessment & Plan Note (Signed)
 Hypertension Blood pressure controlled with amlodipine and benazepril. - Continue amlodipine and benazepril 10 mg/40 mg once daily.

## 2024-08-21 ENCOUNTER — Ambulatory Visit

## 2024-08-21 VITALS — Ht 69.5 in | Wt 195.0 lb

## 2024-08-21 DIAGNOSIS — Z0000 Encounter for general adult medical examination without abnormal findings: Secondary | ICD-10-CM

## 2024-08-21 DIAGNOSIS — Z1159 Encounter for screening for other viral diseases: Secondary | ICD-10-CM | POA: Diagnosis not present

## 2024-08-21 NOTE — Patient Instructions (Addendum)
 Mr. Mendenhall,  Thank you for taking the time for your Medicare Wellness Visit. I appreciate your continued commitment to your health goals. Please review the care plan we discussed, and feel free to reach out if I can assist you further.  Please note that Annual Wellness Visits do not include a physical exam. Some assessments may be limited, especially if the visit was conducted virtually. If needed, we may recommend an in-person follow-up with your provider.  Ongoing Care Seeing your primary care provider every 3 to 6 months helps us  monitor your health and provide consistent, personalized care.   Referrals If a referral was made during today's visit and you haven't received any updates within two weeks, please contact the referred provider directly to check on the status.  Recommended Screenings:  Health Maintenance  Topic Date Due   Hepatitis C Screening  Never done   DTaP/Tdap/Td vaccine (1 - Tdap) Never done   Zoster (Shingles) Vaccine (1 of 2) Never done   Pneumococcal Vaccine for age over 41 (2 of 2 - PCV) 05/08/2013   COVID-19 Vaccine (3 - 2025-26 season) 04/27/2024   Flu Shot  11/24/2024*   Colon Cancer Screening  05/03/2025   Medicare Annual Wellness Visit  08/21/2025   Meningitis B Vaccine  Aged Out  *Topic was postponed. The date shown is not the original due date.       08/21/2024    9:35 AM  Advanced Directives  Does Patient Have a Medical Advance Directive? No  Would patient like information on creating a medical advance directive? No - Patient declined    Vision: Annual vision screenings are recommended for early detection of glaucoma, cataracts, and diabetic retinopathy. These exams can also reveal signs of chronic conditions such as diabetes and high blood pressure.  Dental: Annual dental screenings help detect early signs of oral cancer, gum disease, and other conditions linked to overall health, including heart disease and diabetes.

## 2024-08-21 NOTE — Progress Notes (Addendum)
 "  Chief Complaint  Patient presents with   Medicare Wellness     Subjective:  Please attest and cosign this visit due to patients primary care provider not being in the office at the time the visit was completed.  (Pt of Lauraine Pereyra, NP)   Connor Palmer is a 73 y.o. male who presents for a Medicare Annual Wellness Visit.  Visit info / Clinical Intake: Medicare Wellness Visit Type:: Subsequent Annual Wellness Visit Persons participating in visit and providing information:: patient Medicare Wellness Visit Mode:: Telephone If telephone:: video declined Since this visit was completed virtually, some vitals may be partially provided or unavailable. Missing vitals are due to the limitations of the virtual format.: Documented vitals are patient reported If Telephone or Video please confirm:: I connected with patient using audio/video enable telemedicine. I verified patient identity with two identifiers, discussed telehealth limitations, and patient agreed to proceed. Patient Location:: Home Provider Location:: Office Interpreter Needed?: No Pre-visit prep was completed: yes AWV questionnaire completed by patient prior to visit?: no Living arrangements:: lives with spouse/significant other Patient's Overall Health Status Rating: good Typical amount of pain: none Does pain affect daily life?: no Are you currently prescribed opioids?: no  Dietary Habits and Nutritional Risks How many meals a day?: (!) 1 (1 1/2 -2) Eats fruit and vegetables daily?: yes Most meals are obtained by: having others provide food; preparing own meals; eating out In the last 2 weeks, have you had any of the following?: none Diabetic:: no  Functional Status Activities of Daily Living (to include ambulation/medication): Independent Ambulation: Independent with device- listed below Home Assistive Devices/Equipment: Eyeglasses; Nebulizers; Dentures (specify type) Medication Administration: Independent Home  Management (perform basic housework or laundry): Independent Manage your own finances?: yes Primary transportation is: driving Concerns about vision?: no *vision screening is required for WTM* Concerns about hearing?: (!) yes Uses hearing aids?: (!) yes Hear whispered voice?: yes  Fall Screening Falls in the past year?: 0 Number of falls in past year: 0 Was there an injury with Fall?: 0 Fall Risk Category Calculator: 0 Patient Fall Risk Level: Low Fall Risk  Fall Risk Patient at Risk for Falls Due to: No Fall Risks Fall risk Follow up: Falls evaluation completed; Falls prevention discussed  Home and Transportation Safety: All rugs have non-skid backing?: N/A, no rugs All stairs or steps have railings?: yes Grab bars in the bathtub or shower?: yes Have non-skid surface in bathtub or shower?: yes Good home lighting?: yes Regular seat belt use?: yes Hospital stays in the last year:: no  Cognitive Assessment Difficulty concentrating, remembering, or making decisions? : no Will 6CIT or Mini Cog be Completed: yes What year is it?: 0 points What month is it?: 0 points Give patient an address phrase to remember (5 components): [address] [place] what time is it?: 0 points Count backwards from 20 to 1: 0 points Say the months of the year in reverse: 0 points Repeat the address phrase from earlier: 0 points 6 CIT Score: 0 points  Advance Directives (For Healthcare) Does Patient Have a Medical Advance Directive?: No; Yes Does patient want to make changes to medical advance directive?: Yes (Inpatient - patient requests chaplain consult to change a medical advance directive) Type of Advance Directive: Healthcare Power of [place]; Living will Copy of Healthcare Power of Attorney in Chart?: No - copy requested Copy of Living Will in Chart?: No - copy requested  Reviewed/Updated  Reviewed/Updated: Reviewed All (Medical, Surgical, Family, Medications,  Allergies, Care  Teams, Patient Goals)    Allergies (verified) Niacin, Other, Dust mite extract, Erythromycin, Penicillins, and Proair  hfa [albuterol ]   Current Medications (verified) Outpatient Encounter Medications as of 08/21/2024  Medication Sig   ADVAIR  DISKUS 250-50 MCG/ACT AEPB Inhale 1 puff into the lungs in the morning and at bedtime.   albuterol  (PROAIR  HFA) 108 (90 Base) MCG/ACT inhaler Inhale 2 puffs into the lungs every 6 (six) hours as needed for wheezing or shortness of breath.   albuterol  (PROVENTIL ) (2.5 MG/3ML) 0.083% nebulizer solution Take 3 mLs (2.5 mg total) by nebulization every 6 (six) hours as needed for wheezing or shortness of breath.   amLODipine-benazepril (LOTREL) 10-40 MG capsule Take 1 capsule by mouth daily.   cholecalciferol (VITAMIN D) 1000 units tablet Take 2,000 Units by mouth daily.   colchicine  (COLCRYS ) 0.6 MG tablet Take 1 tablet (0.6 mg total) by mouth daily as needed. May take 1 tablet by mouth twice a day when necessary flares   VENTOLIN  HFA 108 (90 Base) MCG/ACT inhaler Inhale 2 puffs into the lungs every 6 (six) hours as needed for wheezing or shortness of breath.   VITAMIN A PO Take 1 tablet by mouth daily.   No facility-administered encounter medications on file as of 08/21/2024.    History: Past Medical History:  Diagnosis Date   Asthma    Gout    Hypertension    Past Surgical History:  Procedure Laterality Date   NO PAST SURGERIES     Family History  Problem Relation Age of Onset   Rheum arthritis Mother    Allergies Father    Diabetes Mellitus II Father    HIV/AIDS Brother    Aneurysm Sister        Cerebral   Social History   Occupational History   Not on file  Tobacco Use   Smoking status: Former    Current packs/day: 0.00    Average packs/day: 0.3 packs/day for 3.0 years (0.8 ttl pk-yrs)    Types: Cigarettes    Start date: 08/28/1984    Quit date: 08/29/1987    Years since quitting: 37.0   Smokeless tobacco: Never  Vaping Use    Vaping status: Never Used  Substance and Sexual Activity   Alcohol use: Yes    Alcohol/week: 1.0 standard drink of alcohol    Types: 1 Glasses of wine per week    Comment: occas   Drug use: No   Sexual activity: Yes   Tobacco Counseling Counseling given: Yes  SDOH Screenings   Food Insecurity: No Food Insecurity (08/21/2024)  Housing: Unknown (08/21/2024)  Transportation Needs: No Transportation Needs (08/21/2024)  Utilities: Not At Risk (08/21/2024)  Depression (PHQ2-9): Low Risk (08/21/2024)  Physical Activity: Insufficiently Active (08/21/2024)  Social Connections: Socially Integrated (08/21/2024)  Stress: No Stress Concern Present (08/21/2024)  Tobacco Use: Medium Risk (08/21/2024)  Health Literacy: Adequate Health Literacy (08/21/2024)   See flowsheets for full screening details  Depression Screen PHQ 2 & 9 Depression Scale- Over the past 2 weeks, how often have you been bothered by any of the following problems? Little interest or pleasure in doing things: 0 Feeling down, depressed, or hopeless (PHQ Adolescent also includes...irritable): 0 PHQ-2 Total Score: 0 Trouble falling or staying asleep, or sleeping too much: 0 Feeling tired or having little energy: 0 Poor appetite or overeating (PHQ Adolescent also includes...weight loss): 0 Feeling bad about yourself - or that you are a failure or have let yourself or your family down: 0  Trouble concentrating on things, such as reading the newspaper or watching television [facility] Adolescent also includes...like school work): 0 Moving or speaking so slowly that other people could have noticed. Or the opposite - being so fidgety or restless that you have been moving around a lot more than usual: 0 Thoughts that you would be better off dead, or of hurting yourself in some way: 0 PHQ-9 Total Score: 0 If you checked off any problems, how difficult have these problems made it for you to do your work, take care of things at home, or get  along with other people?: Not difficult at all  Depression Treatment Depression Interventions/Treatment : EYV7-0 Score <4 Follow-up Not Indicated     Goals Addressed               This Visit's Progress     Patient Stated (pt-stated)        Patient stated he plans to continue exercising and watching diet              Objective:    Today's Vitals   08/21/24 0933  Weight: 195 lb (88.5 kg)  Height: 5' 9.5 (1.765 m)   Body mass index is 28.38 kg/m.  Hearing/Vision screen Hearing Screening - Comments:: Wears hearing aids Vision Screening - Comments:: Wears rx glasses - up to date with routine eye exams with an Optometrist Immunizations and Health Maintenance Health Maintenance  Topic Date Due   Hepatitis C Screening  Never done   DTaP/Tdap/Td (1 - Tdap) Never done   Zoster Vaccines- Shingrix (1 of 2) Never done   Pneumococcal Vaccine: 50+ Years (2 of 2 - PCV) 05/08/2013   COVID-19 Vaccine (3 - 2025-26 season) 04/27/2024   Influenza Vaccine  11/24/2024 (Originally 03/27/2024)   Colonoscopy  05/03/2025   Medicare Annual Wellness (AWV)  08/21/2025   Meningococcal B Vaccine  Aged Out        Assessment/Plan:  This is a routine wellness examination for Connor Palmer.  Hepatitis C Screening - ordered today  Patient Care Team: Elnor Lauraine BRAVO, NP as PCP - General (Nurse Practitioner)  I have personally reviewed and noted the following in the patients chart:   Medical and social history Use of alcohol, tobacco or illicit drugs  Current medications and supplements including opioid prescriptions. Functional ability and status Nutritional status Physical activity Advanced directives List of other physicians Hospitalizations, surgeries, and ER visits in previous 12 months Vitals Screenings to include cognitive, depression, and falls Referrals and appointments  Orders Placed This Encounter  Procedures   Hepatitis C antibody    Standing Status:   Future    Expiration  Date:   08/21/2025   In addition, I have reviewed and discussed with patient certain preventive protocols, quality metrics, and best practice recommendations. A written personalized care plan for preventive services as well as general preventive health recommendations were provided to patient.   Verdie CHRISTELLA Saba, CMA   08/21/2024   Return in 1 year (on 08/21/2025).  After Visit Summary: (MyChart) Due to this being a telephonic visit, the after visit summary with patients personalized plan was offered to patient via MyChart   Nurse Notes: scheduled 2026 AWV/CPE appts "

## 2024-09-14 ENCOUNTER — Encounter: Admitting: Primary Care

## 2024-09-14 ENCOUNTER — Encounter: Payer: Self-pay | Admitting: Primary Care

## 2024-09-14 DIAGNOSIS — J4489 Other specified chronic obstructive pulmonary disease: Secondary | ICD-10-CM

## 2024-09-14 DIAGNOSIS — J45990 Exercise induced bronchospasm: Secondary | ICD-10-CM

## 2024-09-14 NOTE — Progress Notes (Unsigned)
 "  @Patient  ID: Connor Palmer, male    DOB: November 08, 1950, 74 y.o.   MRN: [id number]  No chief complaint on file.   Referring provider: Elnor Lauraine BRAVO, NP  HPI: 74 year old male, former smoker. PMH significant for COPD/asthma and HTN. Former patient of Dr. Shellia,  Maintained on Advair  diskus (he did not tolerate [place] d/t throat irritation). Recommend repeat spirometry testing if symptoms progress.    Spirometry 03/05/14 >> FEV1 1.06 (34%), FEV1% 43 PFT 04/08/14 >> Pre FEV1 0.99 (34%)/FEV1% 42, Post FEV1 1.28 (45%)/FEV1% 49, TLC 5.65 (85%), DLCO 110% PFT 04/24/21 >> FEV1 1.28 (47%), FEV1% 50, TLC 6.83 (103%), RV 4.02 (171%) DLCO 116%, +BD  Previous [facility] pulmonary encounter:  12/26/2023 Discussed the use of AI scribe software for clinical note transcription with the patient, who gave verbal consent to proceed.  History of Present Illness   Connor Palmer is a 74 year old male with COPD and asthma who presents for a follow-up visit.  He has a history of COPD with an asthmatic component, characterized as moderate to severe obstructive lung disease with reversibility. Pulmonary function tests in 2015 and 2022 showed stable lung function. He is currently on Advair , taken once daily instead of the prescribed twice daily, and uses his rescue inhaler approximately three times a week. No breakthrough symptoms such as shortness of breath, chest tightness, wheezing, or cough.  He is not currently being treated for sleep apnea and reports no issues with sleep, such as falling asleep during the day or experiencing sudden sleep attacks. He sleeps about five to six hours per night without waking up gasping or short of breath.  He is on blood pressure medication, specifically amlodipine (Lortel), and reports consistent use with controlled blood pressure.       09/14/2024 Discussed the use of AI scribe software for clinical note transcription with the patient, who gave verbal consent to proceed.  History of  Present Illness   COPD/asthma - lungs function stable since 2015 Maintained on Advair  twice daily  Rec 1 year follow-up    Allergies[1]  Immunization History  Administered Date(s) Administered   Influenza,inj,Quad PF,6+ Mos 08/31/2013   Influenza,inj,quad, With Preservative 08/31/2013   PFIZER(Purple Top)SARS-COV-2 Vaccination 12/01/2019, 12/23/2019   Pneumococcal Polysaccharide-23 05/08/2012   Typhoid Live 01/07/2015   Yellow Fever 01/07/2015    Past Medical History:  Diagnosis Date   Asthma    Gout    Hypertension     Tobacco History: Tobacco Use History[2] Counseling given: Not Answered   Outpatient Medications Prior to Visit  Medication Sig Dispense Refill   ADVAIR  DISKUS 250-50 MCG/ACT AEPB Inhale 1 puff into the lungs in the morning and at bedtime. 180 each 3   albuterol  (PROAIR  HFA) 108 (90 Base) MCG/ACT inhaler Inhale 2 puffs into the lungs every 6 (six) hours as needed for wheezing or shortness of breath. 18 g 11   albuterol  (PROVENTIL ) (2.5 MG/3ML) 0.083% nebulizer solution Take 3 mLs (2.5 mg total) by nebulization every 6 (six) hours as needed for wheezing or shortness of breath. 120 vial 5   amLODipine-benazepril (LOTREL) 10-40 MG capsule Take 1 capsule by mouth daily.     cholecalciferol (VITAMIN D) 1000 units tablet Take 2,000 Units by mouth daily.     colchicine  (COLCRYS ) 0.6 MG tablet Take 1 tablet (0.6 mg total) by mouth daily as needed. May take 1 tablet by mouth twice a day when necessary flares 100 tablet 3   VENTOLIN  HFA 108 (90 Base) MCG/ACT  inhaler Inhale 2 puffs into the lungs every 6 (six) hours as needed for wheezing or shortness of breath. 3 each 3   VITAMIN A PO Take 1 tablet by mouth daily.     No facility-administered medications prior to visit.      Review of Systems  Review of Systems   Physical Exam  There were no vitals taken for this visit. Physical Exam  ***  Lab Results:  CBC    Component Value Date/Time   WBC 4.0  07/10/2024 1051   RBC 5.40 07/10/2024 1051   HGB 14.9 07/10/2024 1051   HCT 44.9 07/10/2024 1051   PLT 224.0 07/10/2024 1051   MCV 83.1 07/10/2024 1051   MCH 27.6 09/06/2016 1059   MCHC 33.2 07/10/2024 1051   RDW 14.8 07/10/2024 1051   LYMPHSABS 2,300 09/06/2016 1059   MONOABS 368 09/06/2016 1059   EOSABS 92 09/06/2016 1059   BASOSABS 0 09/06/2016 1059    BMET    Component Value Date/Time   NA 140 07/10/2024 1051   K 3.3 (L) 07/10/2024 1051   CL 100 07/10/2024 1051   CO2 31 07/10/2024 1051   GLUCOSE 84 07/10/2024 1051   BUN 10 07/10/2024 1051   CREATININE 0.94 07/10/2024 1051   CREATININE 1.07 09/06/2016 1059   CALCIUM 9.1 07/10/2024 1051   GFRNONAA 72 09/06/2016 1059   GFRAA 84 09/06/2016 1059    BNP No results found for: BNP  ProBNP No results found for: PROBNP  Imaging: No results found.   Assessment & Plan:   No problem-specific Assessment & Plan notes found for this encounter.   1. Asthma with COPD (HCC) (Primary)  2. Exercise-induced asthma   Assessment and Plan Assessment & Plan       I personally spent a total of *** minutes in the care of the patient today including {Time Based Coding:210964241}.   Almarie LELON Ferrari, NP 09/14/2024    [1]  Allergies Allergen Reactions   Niacin Swelling   Other Swelling    Pistachio, Cashews.....   Rash    Dust Mite Extract Other (See Comments)    Sinus issues   Erythromycin     Rash, bumps   Penicillins     unknown   Proair  Hfa [Albuterol ]   [2]  Social History Tobacco Use  Smoking Status Former   Current packs/day: 0.00   Average packs/day: 0.3 packs/day for 3.0 years (0.8 ttl pk-yrs)   Types: Cigarettes   Start date: 08/28/1984   Quit date: 08/29/1987   Years since quitting: 37.0  Smokeless Tobacco Never   "

## 2024-11-12 ENCOUNTER — Ambulatory Visit: Admitting: Nurse Practitioner

## 2025-08-26 ENCOUNTER — Ambulatory Visit

## 2025-08-26 ENCOUNTER — Encounter: Admitting: Nurse Practitioner
